# Patient Record
Sex: Female | Born: 1990 | Hispanic: Yes | Marital: Married | Smoking: Former smoker
Health system: Southern US, Community
[De-identification: ages and names within clinical notes are randomized; demographics above are authoritative.]

## PROBLEM LIST (undated history)

## (undated) ENCOUNTER — Inpatient Hospital Stay (HOSPITAL_COMMUNITY): Payer: Self-pay

## (undated) DIAGNOSIS — Z789 Other specified health status: Secondary | ICD-10-CM

## (undated) DIAGNOSIS — F99 Mental disorder, not otherwise specified: Secondary | ICD-10-CM

## (undated) DIAGNOSIS — O0990 Supervision of high risk pregnancy, unspecified, unspecified trimester: Secondary | ICD-10-CM

## (undated) HISTORY — PX: TUBAL LIGATION: SHX77

## (undated) HISTORY — PX: OTHER SURGICAL HISTORY: SHX169

## (undated) HISTORY — DX: Mental disorder, not otherwise specified: F99

## (undated) HISTORY — DX: Supervision of high risk pregnancy, unspecified, unspecified trimester: O09.90

## (undated) HISTORY — PX: BREAST SURGERY: SHX581

---
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
## 2012-02-29 LAB — OB RESULTS CONSOLE GBS: GBS: NEGATIVE

## 2012-03-02 ENCOUNTER — Encounter (HOSPITAL_COMMUNITY): Payer: Self-pay | Admitting: Pharmacist

## 2012-03-12 ENCOUNTER — Encounter (HOSPITAL_COMMUNITY): Payer: Self-pay

## 2012-03-12 ENCOUNTER — Inpatient Hospital Stay (HOSPITAL_COMMUNITY)
Admission: AD | Admit: 2012-03-12 | Discharge: 2012-03-16 | DRG: 766 | Disposition: A | Discharge status: Home / Self Care | Payer: Medicaid Other | Source: Ambulatory Visit | Attending: Obstetrics and Gynecology | Admitting: Obstetrics and Gynecology

## 2012-03-12 DIAGNOSIS — O34219 Maternal care for unspecified type scar from previous cesarean delivery: Principal | ICD-10-CM | POA: Diagnosis present

## 2012-03-12 DIAGNOSIS — Z98891 History of uterine scar from previous surgery: Secondary | ICD-10-CM

## 2012-03-12 DIAGNOSIS — O4290 Premature rupture of membranes, unspecified as to length of time between rupture and onset of labor, unspecified weeks of gestation: Secondary | ICD-10-CM | POA: Diagnosis present

## 2012-03-12 NOTE — MAU Note (Signed)
Pt states, " I started leaking at 1 pm; I bent over and then stood up and warer ran down my legs and it was enough that it dripped on the floor. It has happened three more time since then but not as much."

## 2012-03-13 ENCOUNTER — Encounter (HOSPITAL_COMMUNITY): Payer: Self-pay

## 2012-03-13 ENCOUNTER — Encounter (HOSPITAL_COMMUNITY): Payer: Self-pay | Admitting: *Deleted

## 2012-03-13 ENCOUNTER — Inpatient Hospital Stay (HOSPITAL_COMMUNITY): Payer: Medicaid Other

## 2012-03-13 ENCOUNTER — Encounter (HOSPITAL_COMMUNITY)
Admission: AD | Disposition: A | Discharge status: Home / Self Care | Payer: Self-pay | Source: Ambulatory Visit | Attending: Obstetrics and Gynecology

## 2012-03-13 LAB — CBC
Platelets: 320 10*3/uL (ref 150–400)
RBC: 4.73 MIL/uL (ref 3.87–5.11)
RDW: 14.1 % (ref 11.5–15.5)
WBC: 10.4 10*3/uL (ref 4.0–10.5)

## 2012-03-13 LAB — AMNISURE RUPTURE OF MEMBRANE (ROM) NOT AT ARMC: Amnisure ROM: POSITIVE

## 2012-03-13 LAB — RPR: RPR Ser Ql: NONREACTIVE

## 2012-03-13 SURGERY — Surgical Case
Anesthesia: Spinal | Site: Abdomen | Wound class: Clean Contaminated

## 2012-03-13 MED ORDER — SIMETHICONE 80 MG PO CHEW
80.0000 mg | CHEWABLE_TABLET | Freq: Three times a day (TID) | ORAL | Status: DC
  Administered 2012-03-13 – 2012-03-16 (×11): 80 mg via ORAL

## 2012-03-13 MED ORDER — DIBUCAINE 1 % RE OINT: 1.0000 "application " | TOPICAL_OINTMENT | RECTAL | Status: DC | PRN

## 2012-03-13 MED ORDER — ONDANSETRON HCL 4 MG/2ML IJ SOLN: 4.0000 mg | Freq: Four times a day (QID) | INTRAMUSCULAR | Status: DC | PRN

## 2012-03-13 MED ORDER — OXYCODONE-ACETAMINOPHEN 5-325 MG PO TABS
1.0000 | ORAL_TABLET | ORAL | Status: DC | PRN
  Administered 2012-03-14: 2 via ORAL
  Administered 2012-03-14: 1 via ORAL
  Administered 2012-03-14: 2 via ORAL
  Administered 2012-03-14 – 2012-03-15 (×6): 1 via ORAL
  Administered 2012-03-16 (×3): 2 via ORAL
  Filled 2012-03-13: qty 2
  Filled 2012-03-13: qty 1
  Filled 2012-03-13: qty 2
  Filled 2012-03-13 (×2): qty 1
  Filled 2012-03-13: qty 2
  Filled 2012-03-13 (×4): qty 1
  Filled 2012-03-13 (×2): qty 2

## 2012-03-13 MED ORDER — ONDANSETRON HCL 4 MG/2ML IJ SOLN
4.0000 mg | Freq: Three times a day (TID) | INTRAMUSCULAR | Status: DC | PRN
Start: 2012-03-13 — End: 2012-03-16

## 2012-03-13 MED ORDER — CITRIC ACID-SODIUM CITRATE 334-500 MG/5ML PO SOLN
30.0000 mL | ORAL | Status: DC | PRN
  Administered 2012-03-13: 30 mL via ORAL
  Filled 2012-03-13: qty 15

## 2012-03-13 MED ORDER — LACTATED RINGERS IV SOLN: INTRAVENOUS | Status: DC

## 2012-03-13 MED ORDER — LIDOCAINE HCL (PF) 1 % IJ SOLN: 30.0000 mL | INTRAMUSCULAR | Status: DC | PRN

## 2012-03-13 MED ORDER — PRENATAL MULTIVITAMIN CH
1.0000 | ORAL_TABLET | Freq: Every day | ORAL | Status: DC
  Administered 2012-03-14 – 2012-03-16 (×3): 1 via ORAL
  Filled 2012-03-13 (×3): qty 1

## 2012-03-13 MED ORDER — OXYTOCIN 20 UNITS IN LACTATED RINGERS INFUSION - SIMPLE: 125.0000 mL/h | Freq: Once | INTRAVENOUS | Status: DC

## 2012-03-13 MED ORDER — NALBUPHINE HCL 10 MG/ML IJ SOLN
5.0000 mg | INTRAMUSCULAR | Status: DC | PRN
  Filled 2012-03-13: qty 1

## 2012-03-13 MED ORDER — MAGNESIUM HYDROXIDE 400 MG/5ML PO SUSP: 30.0000 mL | ORAL | Status: DC | PRN

## 2012-03-13 MED ORDER — EPHEDRINE SULFATE 50 MG/ML IJ SOLN
INTRAMUSCULAR | Status: DC | PRN
  Administered 2012-03-13: 20 mg via INTRAVENOUS
  Administered 2012-03-13: 30 mg via INTRAVENOUS
  Administered 2012-03-13: 10 mg via INTRAVENOUS

## 2012-03-13 MED ORDER — CEFAZOLIN SODIUM 1-5 GM-% IV SOLN
1.0000 g | INTRAVENOUS | Status: DC
  Filled 2012-03-13: qty 50

## 2012-03-13 MED ORDER — LANOLIN HYDROUS EX OINT: 1.0000 "application " | TOPICAL_OINTMENT | CUTANEOUS | Status: DC | PRN

## 2012-03-13 MED ORDER — OXYTOCIN 20 UNITS IN LACTATED RINGERS INFUSION - SIMPLE
125.0000 mL/h | INTRAVENOUS | Status: AC
  Administered 2012-03-13: 125 mL/h via INTRAVENOUS
  Filled 2012-03-13: qty 1000

## 2012-03-13 MED ORDER — DIPHENHYDRAMINE HCL 25 MG PO CAPS: 25.0000 mg | ORAL_CAPSULE | Freq: Four times a day (QID) | ORAL | Status: DC | PRN

## 2012-03-13 MED ORDER — DIPHENHYDRAMINE HCL 50 MG/ML IJ SOLN
12.5000 mg | INTRAMUSCULAR | Status: DC | PRN
  Administered 2012-03-13: 12.5 mg via INTRAVENOUS

## 2012-03-13 MED ORDER — NALOXONE HCL 0.4 MG/ML IJ SOLN: 0.4000 mg | INTRAMUSCULAR | Status: DC | PRN

## 2012-03-13 MED ORDER — HYDROMORPHONE HCL PF 1 MG/ML IJ SOLN: 0.2500 mg | INTRAMUSCULAR | Status: DC | PRN

## 2012-03-13 MED ORDER — TETANUS-DIPHTH-ACELL PERTUSSIS 5-2.5-18.5 LF-MCG/0.5 IM SUSP: 0.5000 mL | Freq: Once | INTRAMUSCULAR | Status: DC

## 2012-03-13 MED ORDER — FENTANYL CITRATE 0.05 MG/ML IJ SOLN
INTRAMUSCULAR | Status: DC | PRN
  Administered 2012-03-13 (×3): 25 ug via INTRAVENOUS
  Administered 2012-03-13: 25 ug via INTRATHECAL

## 2012-03-13 MED ORDER — KETOROLAC TROMETHAMINE 30 MG/ML IJ SOLN: 30.0000 mg | Freq: Four times a day (QID) | INTRAMUSCULAR | Status: AC | PRN

## 2012-03-13 MED ORDER — OXYTOCIN BOLUS FROM INFUSION
500.0000 mL | Freq: Once | INTRAVENOUS | Status: DC
  Filled 2012-03-13: qty 500

## 2012-03-13 MED ORDER — DIPHENHYDRAMINE HCL 50 MG/ML IJ SOLN
INTRAMUSCULAR | Status: AC
  Filled 2012-03-13: qty 1

## 2012-03-13 MED ORDER — KETOROLAC TROMETHAMINE 60 MG/2ML IM SOLN
INTRAMUSCULAR | Status: AC
  Filled 2012-03-13: qty 2

## 2012-03-13 MED ORDER — DIPHENHYDRAMINE HCL 25 MG PO CAPS
25.0000 mg | ORAL_CAPSULE | ORAL | Status: DC | PRN
  Filled 2012-03-13: qty 1

## 2012-03-13 MED ORDER — ACETAMINOPHEN 325 MG PO TABS: 650.0000 mg | ORAL_TABLET | ORAL | Status: DC | PRN

## 2012-03-13 MED ORDER — LACTATED RINGERS IV SOLN
INTRAVENOUS | Status: DC
  Administered 2012-03-13: 06:00:00 via INTRAVENOUS

## 2012-03-13 MED ORDER — ONDANSETRON HCL 4 MG/2ML IJ SOLN: 4.0000 mg | INTRAMUSCULAR | Status: DC | PRN

## 2012-03-13 MED ORDER — BUPIVACAINE IN DEXTROSE 0.75-8.25 % IT SOLN
INTRATHECAL | Status: DC | PRN
  Administered 2012-03-13: 1.3 mL via INTRATHECAL

## 2012-03-13 MED ORDER — WITCH HAZEL-GLYCERIN EX PADS: 1.0000 "application " | MEDICATED_PAD | CUTANEOUS | Status: DC | PRN

## 2012-03-13 MED ORDER — SENNOSIDES-DOCUSATE SODIUM 8.6-50 MG PO TABS
2.0000 | ORAL_TABLET | Freq: Every day | ORAL | Status: DC
  Administered 2012-03-13 – 2012-03-15 (×3): 2 via ORAL

## 2012-03-13 MED ORDER — OXYTOCIN 10 UNIT/ML IJ SOLN
INTRAMUSCULAR | Status: AC
  Filled 2012-03-13: qty 4

## 2012-03-13 MED ORDER — KETOROLAC TROMETHAMINE 60 MG/2ML IM SOLN
60.0000 mg | Freq: Once | INTRAMUSCULAR | Status: AC | PRN
  Administered 2012-03-13: 60 mg via INTRAMUSCULAR

## 2012-03-13 MED ORDER — ONDANSETRON HCL 4 MG/2ML IJ SOLN
INTRAMUSCULAR | Status: AC
  Filled 2012-03-13: qty 2

## 2012-03-13 MED ORDER — ONDANSETRON HCL 4 MG PO TABS: 4.0000 mg | ORAL_TABLET | ORAL | Status: DC | PRN

## 2012-03-13 MED ORDER — FLEET ENEMA 7-19 GM/118ML RE ENEM: 1.0000 | ENEMA | RECTAL | Status: DC | PRN

## 2012-03-13 MED ORDER — SIMETHICONE 80 MG PO CHEW: 80.0000 mg | CHEWABLE_TABLET | ORAL | Status: DC | PRN

## 2012-03-13 MED ORDER — IBUPROFEN 600 MG PO TABS: 600.0000 mg | ORAL_TABLET | Freq: Four times a day (QID) | ORAL | Status: DC | PRN

## 2012-03-13 MED ORDER — PHENYLEPHRINE HCL 10 MG/ML IJ SOLN
INTRAMUSCULAR | Status: DC | PRN
  Administered 2012-03-13: 40 ug via INTRAVENOUS

## 2012-03-13 MED ORDER — ONDANSETRON HCL 4 MG/2ML IJ SOLN
INTRAMUSCULAR | Status: DC | PRN
  Administered 2012-03-13: 4 mg via INTRAVENOUS

## 2012-03-13 MED ORDER — EPHEDRINE 5 MG/ML INJ
INTRAVENOUS | Status: AC
  Filled 2012-03-13: qty 10

## 2012-03-13 MED ORDER — MORPHINE SULFATE 0.5 MG/ML IJ SOLN
INTRAMUSCULAR | Status: AC
  Filled 2012-03-13: qty 10

## 2012-03-13 MED ORDER — LACTATED RINGERS IV SOLN: 500.0000 mL | INTRAVENOUS | Status: DC | PRN

## 2012-03-13 MED ORDER — OXYCODONE-ACETAMINOPHEN 5-325 MG PO TABS: 1.0000 | ORAL_TABLET | ORAL | Status: DC | PRN

## 2012-03-13 MED ORDER — LACTATED RINGERS IV SOLN
INTRAVENOUS | Status: DC | PRN
  Administered 2012-03-13 (×3): via INTRAVENOUS

## 2012-03-13 MED ORDER — OXYTOCIN 10 UNIT/ML IJ SOLN
INTRAMUSCULAR | Status: DC | PRN
  Administered 2012-03-13: 20 [IU] via INTRAMUSCULAR
  Administered 2012-03-13: 20 [IU]

## 2012-03-13 MED ORDER — SCOPOLAMINE 1 MG/3DAYS TD PT72
MEDICATED_PATCH | TRANSDERMAL | Status: AC
  Filled 2012-03-13: qty 1

## 2012-03-13 MED ORDER — KETOROLAC TROMETHAMINE 30 MG/ML IJ SOLN
30.0000 mg | Freq: Four times a day (QID) | INTRAMUSCULAR | Status: AC | PRN
  Administered 2012-03-13: 30 mg via INTRAVENOUS
  Filled 2012-03-13: qty 1

## 2012-03-13 MED ORDER — DIPHENHYDRAMINE HCL 50 MG/ML IJ SOLN: 25.0000 mg | INTRAMUSCULAR | Status: DC | PRN

## 2012-03-13 MED ORDER — MEPERIDINE HCL 25 MG/ML IJ SOLN: 6.2500 mg | INTRAMUSCULAR | Status: DC | PRN

## 2012-03-13 MED ORDER — SODIUM CHLORIDE 0.9 % IR SOLN
Status: DC | PRN
  Administered 2012-03-13: 1000 mL

## 2012-03-13 MED ORDER — MORPHINE SULFATE (PF) 0.5 MG/ML IJ SOLN
INTRAMUSCULAR | Status: DC | PRN
  Administered 2012-03-13: .15 mg via INTRATHECAL

## 2012-03-13 MED ORDER — PHENYLEPHRINE 40 MCG/ML (10ML) SYRINGE FOR IV PUSH (FOR BLOOD PRESSURE SUPPORT)
PREFILLED_SYRINGE | INTRAVENOUS | Status: AC
  Filled 2012-03-13: qty 5

## 2012-03-13 MED ORDER — MENTHOL 3 MG MT LOZG: 1.0000 | LOZENGE | OROMUCOSAL | Status: DC | PRN

## 2012-03-13 MED ORDER — IBUPROFEN 600 MG PO TABS
600.0000 mg | ORAL_TABLET | Freq: Four times a day (QID) | ORAL | Status: DC
  Administered 2012-03-13 – 2012-03-16 (×9): 600 mg via ORAL
  Filled 2012-03-13 (×9): qty 1

## 2012-03-13 MED ORDER — SCOPOLAMINE 1 MG/3DAYS TD PT72
1.0000 | MEDICATED_PATCH | Freq: Once | TRANSDERMAL | Status: AC
  Administered 2012-03-13: 1.5 mg via TRANSDERMAL

## 2012-03-13 MED ORDER — FENTANYL CITRATE 0.05 MG/ML IJ SOLN
INTRAMUSCULAR | Status: AC
  Filled 2012-03-13: qty 2

## 2012-03-13 MED ORDER — SODIUM CHLORIDE 0.9 % IV SOLN
1.0000 ug/kg/h | INTRAVENOUS | Status: DC | PRN
  Filled 2012-03-13: qty 2.5

## 2012-03-13 MED ORDER — MEASLES, MUMPS & RUBELLA VAC ~~LOC~~ INJ: 0.5000 mL | INJECTION | Freq: Once | SUBCUTANEOUS | Status: DC

## 2012-03-13 MED ORDER — SODIUM CHLORIDE 0.9 % IJ SOLN: 3.0000 mL | INTRAMUSCULAR | Status: DC | PRN

## 2012-03-13 MED ORDER — CEFAZOLIN SODIUM 1-5 GM-% IV SOLN
1.0000 g | Freq: Once | INTRAVENOUS | Status: AC
  Administered 2012-03-13: 1 g via INTRAVENOUS

## 2012-03-13 MED ORDER — ZOLPIDEM TARTRATE 5 MG PO TABS: 5.0000 mg | ORAL_TABLET | Freq: Every evening | ORAL | Status: DC | PRN

## 2012-03-13 MED ORDER — METOCLOPRAMIDE HCL 5 MG/ML IJ SOLN: 10.0000 mg | Freq: Three times a day (TID) | INTRAMUSCULAR | Status: DC | PRN

## 2012-03-13 SURGICAL SUPPLY — 29 items
CHLORAPREP W/TINT 26ML (MISCELLANEOUS) ×2 IMPLANT
CLOTH BEACON ORANGE TIMEOUT ST (SAFETY) ×2 IMPLANT
CONTAINER PREFILL 10% NBF 15ML (MISCELLANEOUS) IMPLANT
DRSG COVADERM 4X10 (GAUZE/BANDAGES/DRESSINGS) ×2 IMPLANT
ELECT REM PT RETURN 9FT ADLT (ELECTROSURGICAL) ×2
ELECTRODE REM PT RTRN 9FT ADLT (ELECTROSURGICAL) ×1 IMPLANT
EXTRACTOR VACUUM KIWI (MISCELLANEOUS) IMPLANT
EXTRACTOR VACUUM M CUP 4 TUBE (SUCTIONS) IMPLANT
GLOVE BIO SURGEON STRL SZ8 (GLOVE) ×2 IMPLANT
GLOVE ORTHO TXT STRL SZ7.5 (GLOVE) ×2 IMPLANT
GOWN PREVENTION PLUS LG XLONG (DISPOSABLE) ×4 IMPLANT
KIT ABG SYR 3ML LUER SLIP (SYRINGE) IMPLANT
NEEDLE HYPO 25X5/8 SAFETYGLIDE (NEEDLE) ×2 IMPLANT
NS IRRIG 1000ML POUR BTL (IV SOLUTION) ×2 IMPLANT
PACK C SECTION WH (CUSTOM PROCEDURE TRAY) ×2 IMPLANT
PAD ABD 7.5X8 STRL (GAUZE/BANDAGES/DRESSINGS) ×2 IMPLANT
RTRCTR C-SECT PINK 25CM LRG (MISCELLANEOUS) ×2 IMPLANT
SLEEVE SCD COMPRESS KNEE MED (MISCELLANEOUS) ×2 IMPLANT
STAPLER VISISTAT 35W (STAPLE) IMPLANT
SUT CHROMIC 1 CTX 36 (SUTURE) ×4 IMPLANT
SUT PLAIN 0 NONE (SUTURE) IMPLANT
SUT PLAIN 2 0 XLH (SUTURE) IMPLANT
SUT VIC AB 0 CT1 27 (SUTURE) ×2
SUT VIC AB 0 CT1 27XBRD ANBCTR (SUTURE) ×2 IMPLANT
SUT VIC AB 4-0 KS 27 (SUTURE) ×2 IMPLANT
TAPE CLOTH SURG 4X10 WHT LF (GAUZE/BANDAGES/DRESSINGS) ×2 IMPLANT
TOWEL OR 17X24 6PK STRL BLUE (TOWEL DISPOSABLE) ×4 IMPLANT
TRAY FOLEY CATH 14FR (SET/KITS/TRAYS/PACK) ×2 IMPLANT
WATER STERILE IRR 1000ML POUR (IV SOLUTION) ×2 IMPLANT

## 2012-03-13 NOTE — Consult Note (Signed)
Neonatology Note:  Attendance at C-section:  I was asked to attend this repeat C/S at 38 weeks due to SROM. The mother is a G2P1 O pos, GBS neg with an uncomplicated pregnancy. ROM 20 hour prior to delivery, fluid clear. Infant vigorous with good spontaneous cry and tone. Needed only minimal bulb suctioning. Ap 9/9. Lungs clear to ausc in DR. To CN to care of Pediatrician.  Ermal Brzozowski, MD  

## 2012-03-13 NOTE — Anesthesia Postprocedure Evaluation (Signed)
  Anesthesia Post-op Note  Patient: Carrie Alvarado  Procedure(s) Performed: Procedure(s) (LRB): CESAREAN SECTION (N/A)  Patient Location: PACU and Mother/Baby  Anesthesia Type: Spinal  Level of Consciousness: awake, alert  and oriented  Airway and Oxygen Therapy: Patient Spontanous Breathing  Post-op Pain: mild  Post-op Assessment: Post-op Vital signs reviewed  Post-op Vital Signs: Reviewed and stable  Complications: No apparent anesthesia complications

## 2012-03-13 NOTE — Anesthesia Preprocedure Evaluation (Signed)

## 2012-03-13 NOTE — Op Note (Signed)
Preoperative diagnosis: Intrauterine pregnancy at 38 weeks, PROM, previous cesarean section Postoperative diagnosis: Same Procedure: Repeat low transverse cesarean section without extensions Surgeon: Lavina Hamman M.D. Anesthesia: Spinal Findings: Patient had normal gravid anatomy and delivered a viable female infant with Apgars of 9 and 9, weight pending Estimated blood loss: 800 cc Specimens: Placenta sent to labor and delivery Complications: None  Procedure in detail: The patient was taken to the operating room and placed in the sitting position. Dr. Cristela Blue instilled spinal anesthesia. Abdomen was then prepped and draped in the usual sterile fashion. A Foley catheter was placed. The level of her anesthesia was found to be adequate. Abdomen was entered via a standard Pfannenstiel incision through her previous scar. Once the peritoneal cavity was entered the Alexis disposable self-retaining retractor was placed and good visualization was achieved. A 4 cm transverse incision was then made in the lower uterine segment pushing the bladder inferior. Once the uterine cavity was entered the incision was extended digitally. The fetal vertex was grasped and delivered through the incision atraumatically. Mouth and nares were suctioned. The remainder of the infant then delivered atraumatically. Cord was doubly clamped and cut and the infant handed to the awaiting pediatric team. Cord blood was obtained. The placenta delivered spontaneously. Uterus was wiped dry with clean lap pad and all clots and debris were removed. Uterine incision was inspected and found to be free of extensions. Uterine incision was closed in 1 layer with running locking layer with #1 chromic.  Bleeding from the right angle was controlled with 2 figure 8 sutures of #1 chromic.  Tubes and ovaries were inspected and found to be normal. Uterine incision was inspected and found to be hemostatic. Bleeding from serosal edges was controlled  with electrocautery. The Alexis retractor was removed. Subfascial space was irrigated and made hemostatic with electrocautery. Fascia was closed in running fashion starting at both ends and meeting in the middle with 0 Vicryl. Subcutaneous tissue was then irrigated and made hemostatic with electrocautery. Skin was closed with staples followed by a sterile dressing. Patient tolerated the procedure well and was taken to the recovery in stable condition. Counts were correct x2, she received Ancef 1 g IV at the beginning of the procedure and she had PAS hose on throughout the procedure.

## 2012-03-13 NOTE — Anesthesia Postprocedure Evaluation (Signed)
  Anesthesia Post-op Note  Patient: Carrie Alvarado  Procedure(s) Performed: Procedure(s) (LRB): CESAREAN SECTION (N/A)  Patient is awake, responsive, moving her legs, and has signs of resolution of her numbness. Pain and nausea are reasonably well controlled. Vital signs are stable and clinically acceptable. Oxygen saturation is clinically acceptable. There are no apparent anesthetic complications at this time. Patient is ready for discharge.

## 2012-03-13 NOTE — H&P (Signed)
Carrie Alvarado is a 21 y.o. female presenting for evaluation of leaking fluid.  She had been leaking small amounts of clear fluid off and on all day.  She presented to MAU last pm with this complaint.  Fern neg, but Amnisure pos.  She ate a full meal at 2100 last pm, so she was admitted for observation for c-section this am for PPROM.  Prenatal care complicated by previous c-section for fetal hydrops and chest mass, baby died.  She was considering VBAC, but wants repeat c-section.  See prenatal records for complete history. . Maternal Medical History:  Reason for admission: Reason for admission: rupture of membranes.  Fetal activity: Perceived fetal activity is normal.    Prenatal complications: no prenatal complications   OB History    Grav Para Term Preterm Abortions TAB SAB Ect Mult Living   2 1 0 1 0 0 0 0 0 0     Baby had hydrops from a chest mass and heart failure, baby died  Past Medical History  Diagnosis Date  . No pertinent past medical history    Past Surgical History  Procedure Date  . Cesarean section    Family History: family history is negative for Hypotension and Anesthesia problems. Social History:  reports that she has quit smoking. She has never used smokeless tobacco. She reports that she does not drink alcohol or use illicit drugs.  Review of Systems  Respiratory: Negative.   Cardiovascular: Negative.     Dilation: 1 Effacement (%): 60 Station: -2 Exam by:: Artelia Laroche CNM Blood pressure 102/57, pulse 80, temperature 98.2 F (36.8 C), temperature source Oral, resp. rate 20, height 5' (1.524 m), weight 70.024 kg (154 lb 6 oz), last menstrual period 06/07/2011, SpO2 100.00%. Maternal Exam:  Uterine Assessment: Contraction strength is mild.  Abdomen: Patient reports no abdominal tenderness. Surgical scars: low transverse.   Estimated fetal weight is 6 1/2 lbs.   Fetal presentation: vertex  Introitus: Normal vulva. Normal vagina.  Ferning test: negative.    Amniotic fluid character: clear.  Pelvis: adequate for delivery.   Cervix: Cervix evaluated by digital exam.     Fetal Exam Fetal Monitor Review: Mode: ultrasound.   Baseline rate: 140.  Variability: moderate (6-25 bpm).   Pattern: accelerations present and no decelerations.    Fetal State Assessment: Category I - tracings are normal.     Physical Exam  Constitutional: She appears well-developed and well-nourished.  Cardiovascular: Normal rate, regular rhythm and normal heart sounds.   No murmur heard. Respiratory: Effort normal and breath sounds normal. No respiratory distress. She has no wheezes.  GI: Soft.       gravid    Prenatal labs: ABO, Rh: --/--/O POS (09/22 0126) Antibody: Negative (11/01 0000) Rubella: Immune (11/01 0000) RPR: Nonreactive (11/01 0000)  HBsAg: Negative (11/01 0000)  HIV: Non-reactive (11/01 0000)  GBS: Negative (04/22 0000)   Assessment/Plan: IUP at 38 weeks with PPROM, prior LTCS, wants repeat c-section.  We are now > 8 hrs since last meal, ok to proceed with repeat c-section.  The procedure and risks have been discussed with the patient.     Brynleigh Sequeira D 03/13/2012, 8:28 AM

## 2012-03-13 NOTE — OR Nursing (Signed)
Uterus massaged by S. Merrily Tegeler RN. Two tubes of cord blood sent to lab.  

## 2012-03-13 NOTE — Transfer of Care (Signed)
Immediate Anesthesia Transfer of Care Note  Patient: Carrie Alvarado  Procedure(s) Performed: Procedure(s) (LRB): CESAREAN SECTION (N/A)  Patient Location: PACU  Anesthesia Type: Spinal  Level of Consciousness: awake, alert  and oriented  Airway & Oxygen Therapy: Patient Spontanous Breathing  Post-op Assessment: Report given to PACU RN and Post -op Vital signs reviewed and stable  Post vital signs: stable  Complications: No apparent anesthesia complications

## 2012-03-13 NOTE — Addendum Note (Signed)
Addendum  created 03/13/12 2126 by Armanda Heritage, RN   Modules edited:Notes Section

## 2012-03-13 NOTE — Anesthesia Procedure Notes (Signed)

## 2012-03-14 ENCOUNTER — Encounter (HOSPITAL_COMMUNITY): Payer: Self-pay | Admitting: Obstetrics and Gynecology

## 2012-03-14 LAB — CBC
Platelets: 294 10*3/uL (ref 150–400)
RBC: 4.05 MIL/uL (ref 3.87–5.11)
RDW: 14.2 % (ref 11.5–15.5)
WBC: 15 10*3/uL — ABNORMAL HIGH (ref 4.0–10.5)

## 2012-03-14 NOTE — Progress Notes (Signed)
Subjective: Postpartum Day #1: Cesarean Delivery Patient reports tolerating PO and no problems voiding.    Objective: Vital signs in last 24 hours: Temp:  [97.3 F (36.3 C)-99.1 F (37.3 C)] 98.6 F (37 C) (05/06 0400) Pulse Rate:  [80-112] 112  (05/06 0400) Resp:  [18-28] 18  (05/06 0400) BP: (98-143)/(45-77) 101/63 mmHg (05/06 0400) SpO2:  [96 %-100 %] 96 % (05/06 0400)  Physical Exam:  General: alert Lochia: appropriate Uterine Fundus: firm Incision: dressing C/D/I   Basename 03/14/12 0530 03/13/12 0055  HGB 10.9* 13.0  HCT 34.3* 39.8    Assessment/Plan: Status post Cesarean section. Doing well postoperatively.  Continue current care, ambulate.  Anabeth Chilcott D 03/14/2012, 8:13 AM

## 2012-03-14 NOTE — Progress Notes (Signed)
UR chart review completed.  

## 2012-03-15 ENCOUNTER — Inpatient Hospital Stay (HOSPITAL_COMMUNITY): Admission: RE | Admit: 2012-03-15 | Payer: Medicaid Other | Source: Ambulatory Visit

## 2012-03-15 NOTE — Progress Notes (Signed)
POD #2 LTCS Doing ok, no problems Afeb, VSS Abd- soft, fundus firm, dressing C/D/I Continue routine care

## 2012-03-16 DIAGNOSIS — Z98891 History of uterine scar from previous surgery: Secondary | ICD-10-CM

## 2012-03-16 MED ORDER — IBUPROFEN 600 MG PO TABS: 600.0000 mg | ORAL_TABLET | Freq: Four times a day (QID) | ORAL | Status: AC

## 2012-03-16 MED ORDER — OXYCODONE-ACETAMINOPHEN 5-325 MG PO TABS: 1.0000 | ORAL_TABLET | ORAL | Status: AC | PRN

## 2012-03-16 NOTE — Clinical Social Work Psychosocial (Signed)
    Clinical Social Work Department BRIEF PSYCHOSOCIAL ASSESSMENT 03/16/2012  Patient:  Carrie Alvarado, Carrie Alvarado     Account Number:  [id number]     Admit date:  03/12/2012  Clinical Social Worker:  Andy Gauss  Date/Time:  03/16/2012 10:30 AM  Referred by:  Physician  Date Referred:  03/16/2012 Referred for  Behavioral Health Issues   Other Referral:   Interview type:  Patient Other interview type:    PSYCHOSOCIAL DATA Living Status:  FAMILY Admitted from facility:   Level of care:   Primary support name:   Primary support relationship to patient:  FRIEND Degree of support available:   Involved    CURRENT CONCERNS Current Concerns  Behavioral Health Issues   Other Concerns:    SOCIAL WORK ASSESSMENT / PLAN Sw met with pt briefly to assess history of depression, after the loss of her child in 2012.  Pt told Sw that she participated in counseling and feels "a lot better now."  She denies any depression since that situation.  FOB is at the bedside and supportive.  Pt denies any Etoh since pregnancy confirmation.  She appears appropriate and happy about the birth of this baby.   Assessment/plan status:  No Further Intervention Required Other assessment/ plan:   Information/referral to community resources:    PATIENT'S/FAMILY'S RESPONSE TO PLAN OF CARE: Pt thanked Sw for consult.

## 2012-03-16 NOTE — Progress Notes (Signed)
POD #3 LTCS No problems Afeb, VSS Abd- soft, fundus firm, incision intact D/c home 

## 2012-03-16 NOTE — Discharge Summary (Signed)
Obstetric Discharge Summary Reason for Admission: rupture of membranes Prenatal Procedures: none Intrapartum Procedures: cesarean: low cervical, transverse Postpartum Procedures: none Complications-Operative and Postpartum: none Hemoglobin  Date Value Range Status  03/14/2012 10.9* 12.0-15.0 (g/dL) Final     HCT  Date Value Range Status  03/14/2012 34.3* 36.0-46.0 (%) Final    Physical Exam:  General: alert Lochia: appropriate Uterine Fundus: firm Incision: healing well  Discharge Diagnoses: Term Pregnancy-delivered  Discharge Information: Date: 03/16/2012 Activity: pelvic rest and no strenuous activity Diet: routine Medications: Ibuprofen and Percocet Condition: stable Instructions: refer to practice specific booklet Discharge to: home Follow-up Information    Follow up with Avea Mcgowen D, MD. Schedule an appointment as soon as possible for a visit in 2 weeks.   Contact information:   [address] [place] [address] [phone]          Newborn Data: Live born female  Birth Weight: 6 lb 13.2 oz (3095 g) APGAR: 9, 9  Home with mother.  Araeya Lamb D 03/16/2012, 7:48 AM

## 2012-03-16 NOTE — Discharge Instructions (Signed)
As per discharge pamphlet °

## 2012-03-21 ENCOUNTER — Encounter (HOSPITAL_COMMUNITY): Admission: RE | Payer: Self-pay | Source: Ambulatory Visit

## 2012-03-21 ENCOUNTER — Inpatient Hospital Stay (HOSPITAL_COMMUNITY)
Admission: RE | Admit: 2012-03-21 | Payer: Medicaid Other | Source: Ambulatory Visit | Admitting: Obstetrics and Gynecology

## 2012-03-21 SURGERY — Surgical Case
Anesthesia: Choice

## 2012-05-04 ENCOUNTER — Emergency Department (HOSPITAL_COMMUNITY)
Admission: EM | Admit: 2012-05-04 | Discharge: 2012-05-04 | Disposition: A | Discharge status: Home / Self Care | Payer: Medicaid Other | Attending: Emergency Medicine | Admitting: Emergency Medicine

## 2012-05-04 ENCOUNTER — Encounter (HOSPITAL_COMMUNITY): Payer: Self-pay | Admitting: Family Medicine

## 2012-05-04 DIAGNOSIS — H1010 Acute atopic conjunctivitis, unspecified eye: Secondary | ICD-10-CM

## 2012-05-04 DIAGNOSIS — H1045 Other chronic allergic conjunctivitis: Secondary | ICD-10-CM | POA: Insufficient documentation

## 2012-05-04 DIAGNOSIS — Z87891 Personal history of nicotine dependence: Secondary | ICD-10-CM | POA: Insufficient documentation

## 2012-05-04 MED ORDER — NEDOCROMIL SODIUM 2 % OP SOLN: 1.0000 [drp] | Freq: Two times a day (BID) | OPHTHALMIC | Status: AC | PRN

## 2012-05-04 MED ORDER — NAPHAZOLINE-PHENIRAMINE 0.025-0.3 % OP SOLN: 1.0000 [drp] | OPHTHALMIC | Status: AC | PRN

## 2012-05-04 NOTE — ED Provider Notes (Signed)
History    This chart was scribed for Carrie Shi, MD, MD by Smitty Pluck. The patient was seen in room TR02C and the patient's care was started at 12:23PM.   CSN: [id number]  Arrival date & time 05/04/12  1048   First MD Initiated Contact with Patient 05/04/12 1222      Chief Complaint  Patient presents with  . Eye Problem    (Consider location/radiation/quality/duration/timing/severity/associated sxs/prior treatment) Patient is a 21 y.o. female presenting with eye problem. The history is provided by the patient.  Eye Problem    Carrie Alvarado is a 21 y.o. female who presents to the Emergency Department complaining of moderate eye pain onset 2 weeks ago. Pt denies wearing contact lenses. She reports that she has drainage in the morning and at night time. Denies allergies. She reports that her eyes are burning and itching. Symptoms have been constant. The itching has spread to her cheeks. She denies changing facial makeup within past couple of weeks. She states symptoms started with itching. She has tried benadryl and eye drops without relief. She reports that her eyes stick together in the morning.   Past Medical History  Diagnosis Date  . No pertinent past medical history     Past Surgical History  Procedure Date  . Cesarean section   . Cesarean section 03/13/2012    Procedure: CESAREAN SECTION;  Surgeon: Lavina Hamman, MD;  Location: [facility];  Service: Gynecology;  Laterality: N/A;  Repeat cesarean section with delivery of baby girl at 11.  Apgars 9/9.    Family History  Problem Relation Age of Onset  . Hypotension Neg Hx   . Anesthesia problems Neg Hx     History  Substance Use Topics  . Smoking status: Former Games developer  . Smokeless tobacco: Never Used  . Alcohol Use: No     not with pregnancy    OB History    Grav Para Term Preterm Abortions TAB SAB Ect Mult Living   2 2 1 1  0 0 0 0 0 1      Review of Systems  All other systems reviewed and are  negative.  10 Systems reviewed and all are negative for acute change except as noted in the HPI.    Allergies  Review of patient's allergies indicates no known allergies.  Home Medications   Current Outpatient Rx  Name Route Sig Dispense Refill  . NAPHAZOLINE-PHENIRAMINE 0.025-0.3 % OP SOLN Ophthalmic Apply 1 drop to eye every 4 (four) hours as needed. 5 mL 1  . NEDOCROMIL SODIUM 2 % OP SOLN Both Eyes Place 1 drop into both eyes 2 (two) times daily as needed. 5 mL 0    BP 104/56  Pulse 74  Temp 97.9 F (36.6 C) (Oral)  Resp 16  SpO2 100%  LMP 04/26/2012  Physical Exam  Nursing note and vitals reviewed. Constitutional: She is oriented to person, place, and time. She appears well-developed and well-nourished. No distress.  HENT:  Head: Normocephalic and atraumatic.  Eyes: EOM are normal. Pupils are equal, round, and reactive to light. Right eye exhibits no exudate. Left eye exhibits no exudate. Right conjunctiva is injected. Right conjunctiva has no hemorrhage. Left conjunctiva is injected. Left conjunctiva has no hemorrhage.  Neck: Normal range of motion.  Cardiovascular: Normal rate and intact distal pulses.   Pulmonary/Chest: No respiratory distress.  Abdominal: Normal appearance. She exhibits no distension.  Musculoskeletal: Normal range of motion.  Neurological: She is alert and oriented to person,  place, and time. No cranial nerve deficit.  Skin: Skin is warm and dry. No rash noted.  Psychiatric: She has a normal mood and affect. Her behavior is normal.    ED Course  Procedures (including critical care time) DIAGNOSTIC STUDIES: Oxygen Saturation is 100% on room air, normal by my interpretation.    COORDINATION OF CARE: 12:27PM EDP discusses pt ED treatment with pt.    Labs Reviewed - No data to display No results found.   1. Allergic conjunctivitis       MDM  I personally performed the services described in this documentation, which was scribed in my  presence. The recorded information has been reviewed and considered.           Carrie Shi, MD 05/04/12 [phone]

## 2012-05-04 NOTE — ED Notes (Signed)
Pt sts itchy watery eyes for a few days. Denies coming into contact with anything new

## 2012-05-04 NOTE — ED Notes (Signed)
Both eyes watering and burning x 1 week eyes are red and sensitive to light

## 2012-05-04 NOTE — ED Notes (Signed)
Pt reports eye burning and irritation x 1 week.  Pt denies contact/glasses.  Denies injury.  No drainage noted.

## 2012-05-04 NOTE — Discharge Instructions (Signed)
Allergic Conjunctivitis  A thin membrane (conjunctiva) covers the eyeball and underside of the eyelids. Allergic conjunctivitis happens when the thin membrane gets irritated from things like animal dander, pollen, perfumes, or smoke (allergens). The membrane may become puffy (swollen) and red. Small bumps may form on the inside of the eyelids. Your eyes may get teary, itchy, or burn. It cannot be passed to another person (contagious).   HOME CARE   Wash your hands before and after applying medicated drops or creams.   Do not touch the drop or cream tube to your eye or eyelids.   Do not use your soft contacts. Throw them away. Use a new pair once recovery is complete.   Do not use your hard contacts. They need to be washed (sterilized) thoroughly after recovery is complete.   Put a cold cloth to your eye(s) if you have itching and burning.  GET HELP RIGHT AWAY IF:    You are not feeling better in 2 to 3 days after treatment.   Your lids are sticky or stick together.   Fluid comes from the eye(s).   You become sensitive to light.   You have a temperature by mouth above 102 F (38.9 C).   You have pain in and around the eye(s).   You start to have vision problems.  MAKE SURE YOU:    Understand these instructions.   Will watch your condition.   Will get help right away if you are not doing well or get worse.  Document Released: 04/15/2010 Document Revised: 10/15/2011 Document Reviewed: 04/15/2010  Exit[employer] Patient Information 2012 [employer].

## 2012-07-16 IMAGING — US US OB TRANSVAGINAL
1 series · 14 of 20 positions shown · non-contrast
Comparison: 08/01/2011

CLINICAL DATA: Fetal heart tones only.  Vaginal bleeding.

TRANSVAGINAL OB ULTRASOUND
TECHNIQUE: Transvaginal ultrasound was performed for evaluation of
the gestation as well as the maternal uterus and adnexal regions.

[Series 1: us ob transvaginal · 20 acquisitions, 14 frames shown]
[im 1/20]
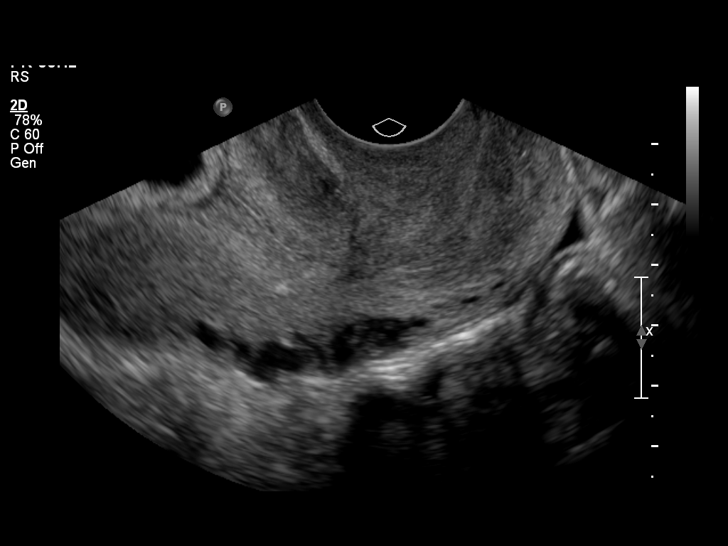
[im 3/20]
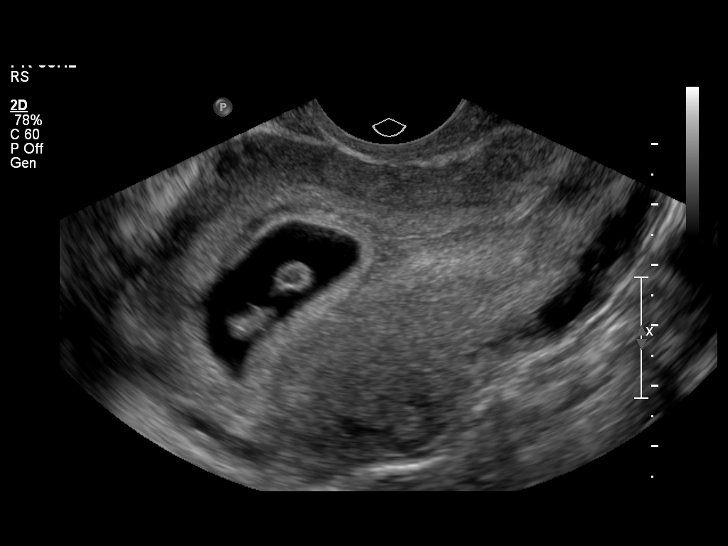
[im 4/20]
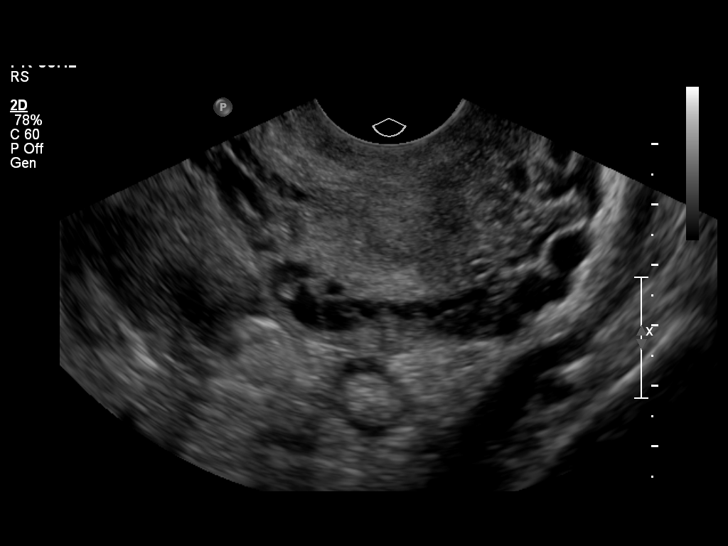
[im 6/20]
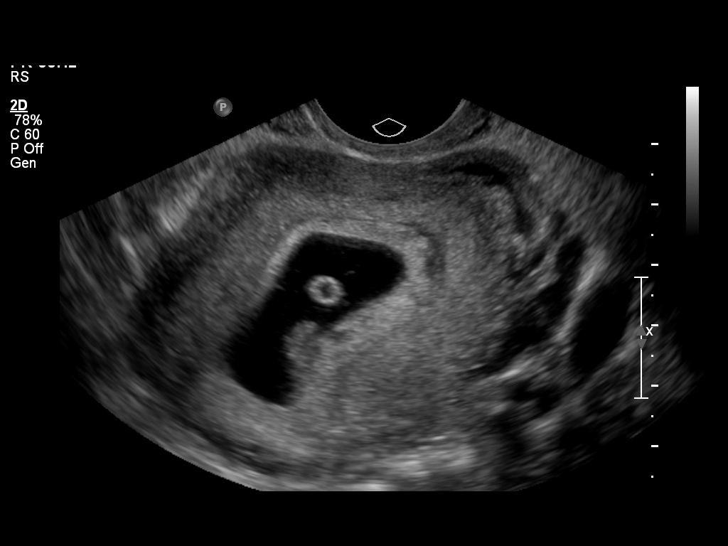
[im 7/20]
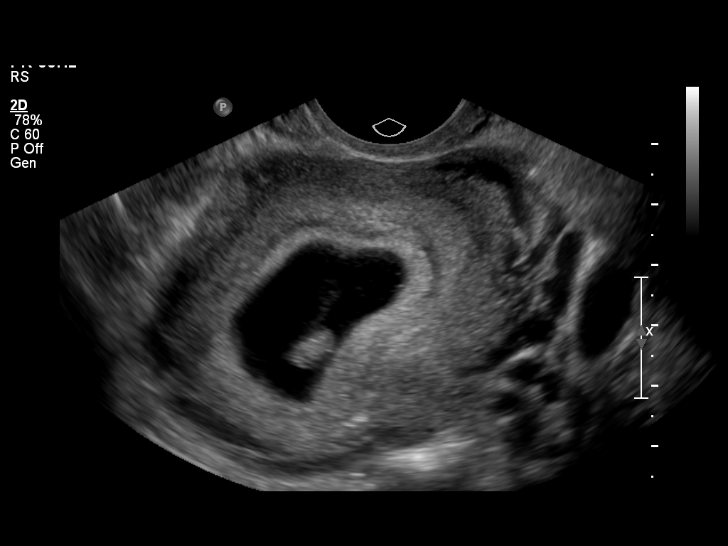
[im 8/20]
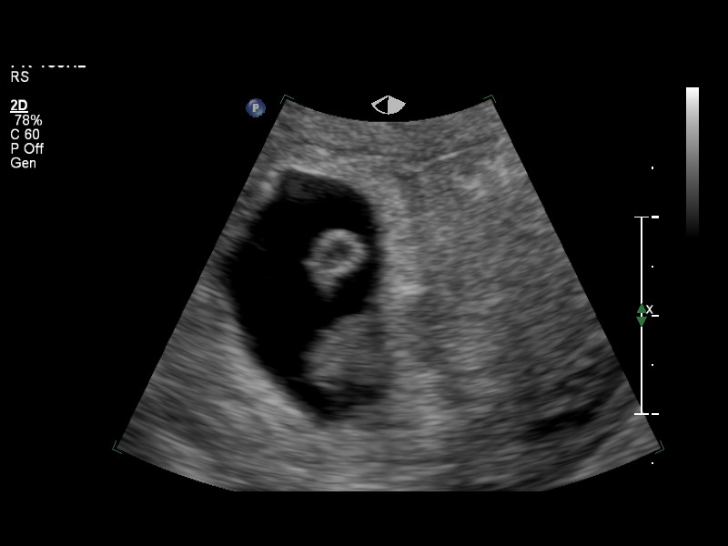
[im 10/20]
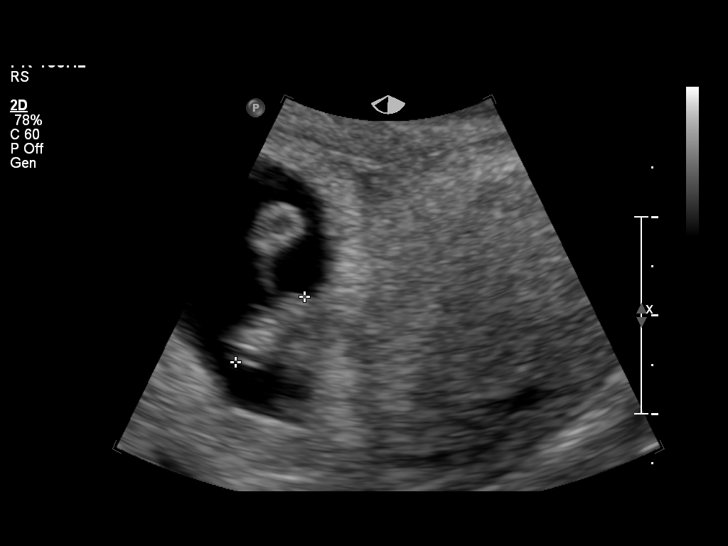
[im 11/20]
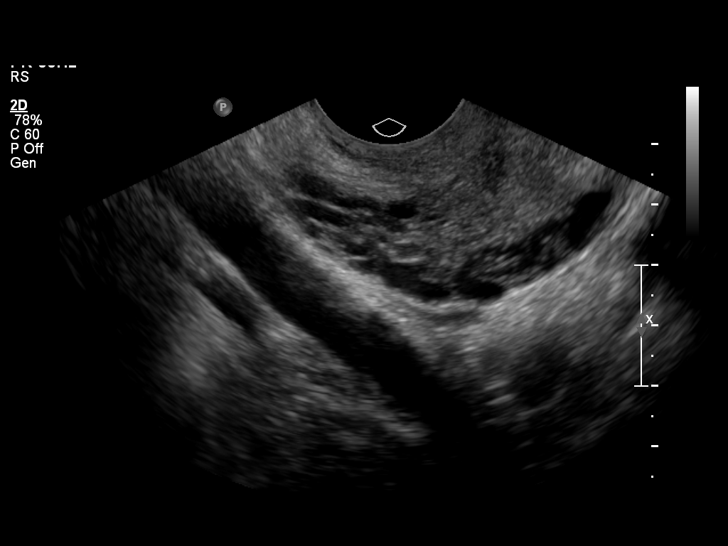
[im 13/20]
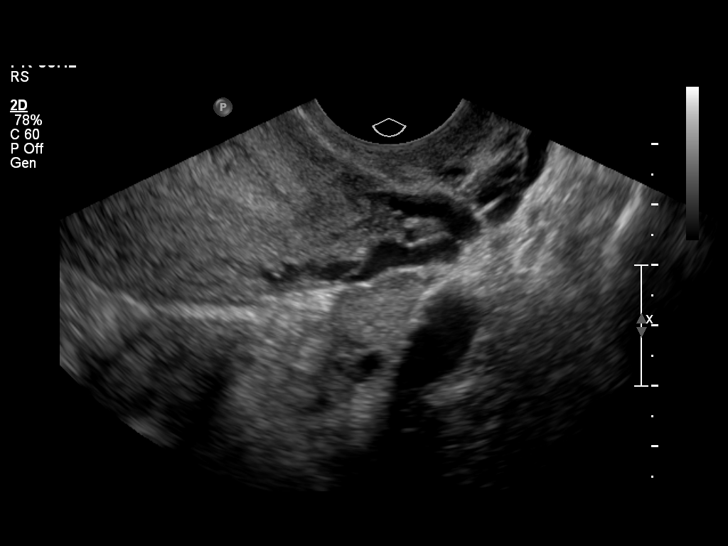
[im 14/20]
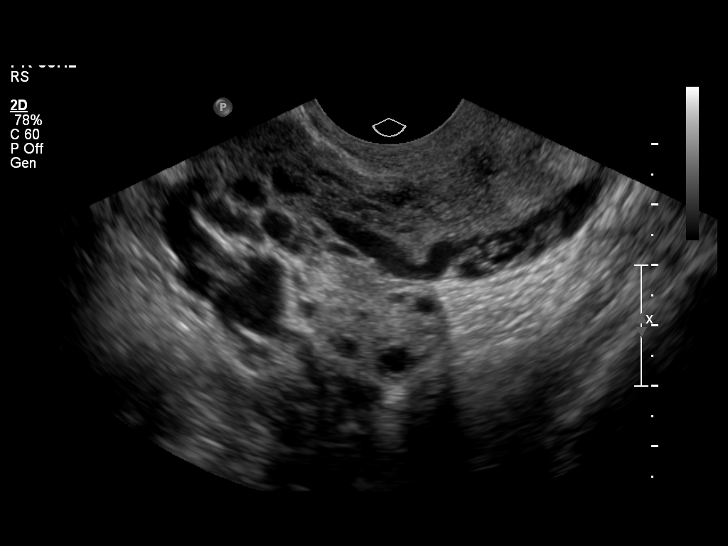
[im 16/20]
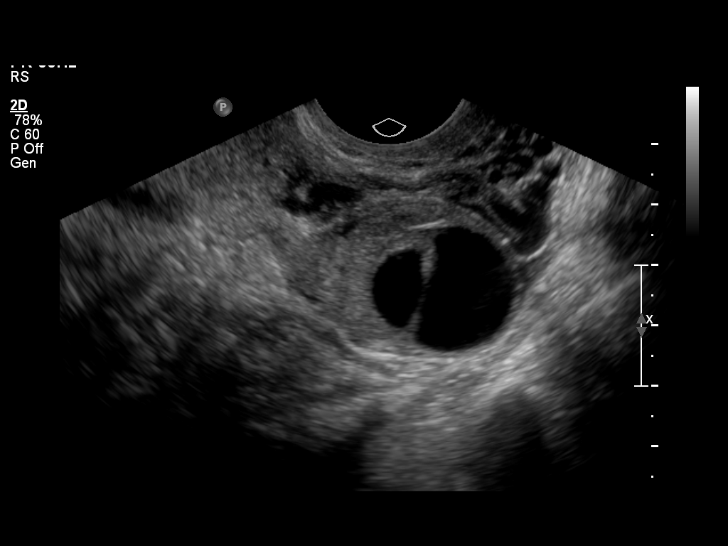
[im 17/20]
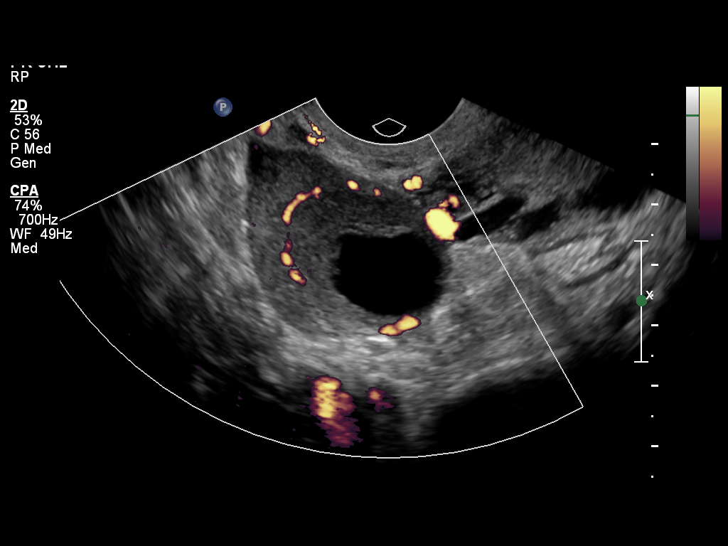
[im 18/20]
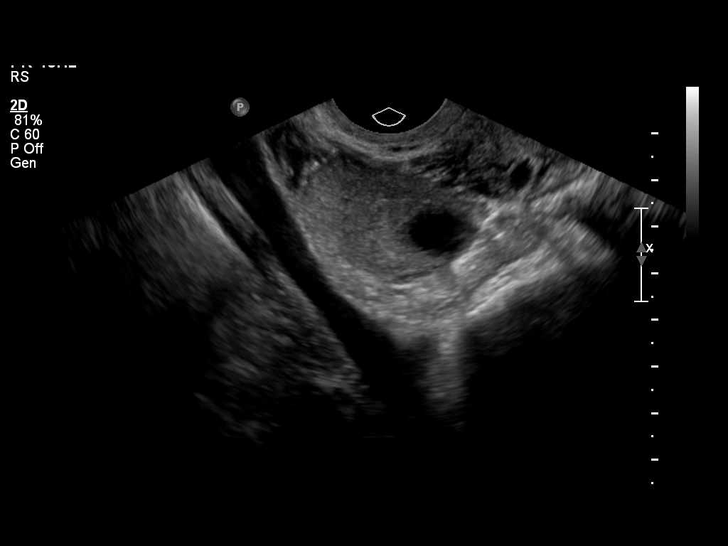
[im 20/20]
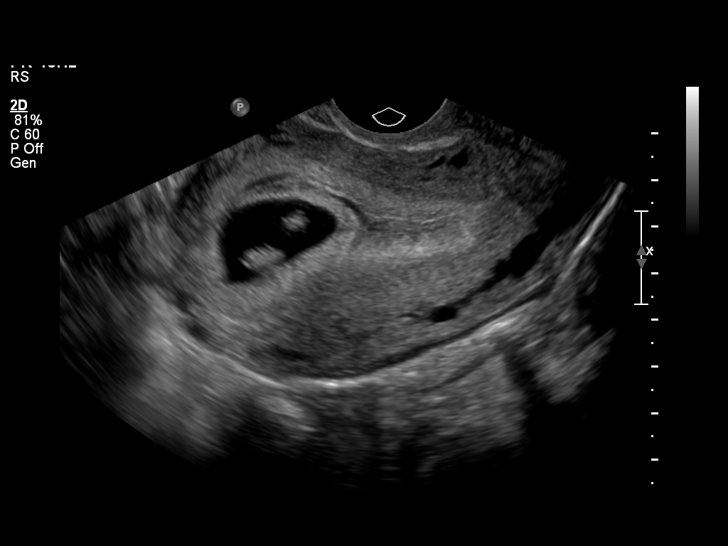

[14 of 20 positions shown; findings below may reference images not displayed]

FINDINGS: A single intrauterine pregnancy is demonstrated.  Fetal
pole and yolk sac are observed.  The crown-rump length measures
mm consistent with estimated gestational age of 7 weeks 1 day.
Fetal cardiac activity is observed with fetal heart rate measured
at 133 beats per minute.  No myometrial masses demonstrated.  No
fluid in the lower uterine segment.  Cyst in the right ovary
suggesting corpus luteal cyst.  Normal follicular changes in the
left ovary.  No free pelvic fluid collections.
IMPRESSION: Single intrauterine pregnancy demonstrated with fetal cardiac
activity observed.  Crown-rump length is consistent with estimated
gestational age of 7 weeks 1 day.

## 2013-06-24 ENCOUNTER — Ambulatory Visit: Payer: Self-pay | Admitting: Family Medicine

## 2013-06-24 VITALS — BP 108/60 | HR 76 | Temp 98.6°F | Resp 16 | Ht 60.0 in | Wt 130.4 lb

## 2013-06-24 DIAGNOSIS — H109 Unspecified conjunctivitis: Secondary | ICD-10-CM

## 2013-06-24 MED ORDER — TOBRAMYCIN 0.3 % OP SOLN: 1.0000 [drp] | Freq: Four times a day (QID) | OPHTHALMIC | Status: DC

## 2013-06-24 NOTE — Progress Notes (Signed)
Chief Complaint:  Chief Complaint  Patient presents with  . Eye Pain    Both Eye pain x 2 weeks    HPI: 22 y.o. year old female presents with a 20 day history of eye redness involving both eye.  no contact use  No sick contacts, recent antibiotics, or recent travels.   No leg trauma, sedentary periods, h/o cancer, or tobacco use.  Past Medical History  Diagnosis Date  . No pertinent past medical history      Home Meds: Prior to Admission medications   Medication Sig Start Date End Date Taking? Authorizing Provider  tobramycin (TOBREX) 0.3 % ophthalmic solution Place 1 drop into both eyes every 6 (six) hours. 06/24/13   Elvina Sidle, MD    Allergies: No Known Allergies  History   Social History  . Marital Status: Single    Spouse Name: N/A    Number of Children: N/A  . Years of Education: N/A   Occupational History  . Not on file.   Social History Main Topics  . Smoking status: Current Some Day Smoker  . Smokeless tobacco: Never Used  . Alcohol Use: No     Comment: not with pregnancy  . Drug Use: No     Comment: not with pregnancy  . Sexual Activity: Yes   Other Topics Concern  . Not on file   Social History Narrative  . No narrative on file     Review of Systems: Constitutional: negative for chills, fever, night sweats or weight changes Cardiovascular: negative for chest pain or palpitations Respiratory: negative for hemoptysis, wheezing, or shortness of breath Abdominal: negative for abdominal pain, nausea, vomiting or diarrhea Dermatological: negative for rash Neurologic: negative for headache   Physical Exam: Blood pressure 108/60, pulse 76, temperature 98.6 F (37 C), temperature source Oral, resp. rate 16, height 5' (1.524 m), weight 130 lb 6.4 oz (59.149 kg), last menstrual period 06/09/2013, SpO2 100.00%., Body mass index is 25.47 kg/(m^2). General: Well developed, well nourished, in no acute distress. Head: Normocephalic, atraumatic, nares  are congested. Bilateral auditory canals clear, TM's are without perforation, pearly grey with reflective cone of light bilaterally. No sinus TTP. Oral cavity moist, dentition normal. Posterior pharynx with post nasal drip and mild erythema. No peritonsillar abscess or tonsillar exudate. Eyes:  Injection both eye, mild discharge at lid margins Neck: Supple. No thyromegaly. Full ROM. No lymphadenopathy. Lungs: Coarse breath sounds bilaterally without wheezes, rales, or rhonchi. Breathing is unlabored.  Heart: RRR with S1 S2. No murmurs, rubs, or gallops appreciated. Msk:  Strength and tone normal for age. Extremities: No clubbing or cyanosis. No edema. Neuro: Alert and oriented X 3. Moves all extremities spontaneously. CNII-XII grossly in tact. Psych:  Responds to questions appropriately with a normal affect.   Labs:   ASSESSMENT AND PLAN:  22 y.o. year old female with conjunctivitis involving each eye Conjunctivitis - Plan: tobramycin (TOBREX) 0.3 % ophthalmic solution  Signed, Elvina Sidle, MD  Pt ed given including hygiene measures and avoiding use of contact lenses -RTC precautions -RTC 3-5 days if no improvement  Signed, Elvina Sidle, MD 06/24/2013 5:37 PM

## 2013-06-24 NOTE — Patient Instructions (Addendum)

## 2013-06-28 ENCOUNTER — Telehealth: Payer: Self-pay

## 2013-06-28 DIAGNOSIS — H109 Unspecified conjunctivitis: Secondary | ICD-10-CM

## 2013-06-28 NOTE — Telephone Encounter (Signed)
Patient was seen for eye issues, meds helped a little, not a lot of improvement.   [phone]

## 2013-06-28 NOTE — Telephone Encounter (Signed)
Dr L any more suggestions?

## 2013-06-29 MED ORDER — PREDNISOLONE ACETATE 1 % OP SUSP: 1.0000 [drp] | Freq: Three times a day (TID) | OPHTHALMIC | Status: DC

## 2013-06-29 NOTE — Telephone Encounter (Signed)
Add these drops for 3 days.

## 2013-06-29 NOTE — Telephone Encounter (Signed)
Called pt, Mailbox is full.

## 2018-06-08 ENCOUNTER — Inpatient Hospital Stay (HOSPITAL_COMMUNITY): Payer: Self-pay

## 2018-06-08 ENCOUNTER — Inpatient Hospital Stay (HOSPITAL_COMMUNITY)
Admission: AD | Admit: 2018-06-08 | Discharge: 2018-06-08 | Disposition: A | Discharge status: Home / Self Care | Payer: Self-pay | Source: Ambulatory Visit | Attending: Family Medicine | Admitting: Family Medicine

## 2018-06-08 ENCOUNTER — Encounter (HOSPITAL_COMMUNITY): Payer: Self-pay | Admitting: *Deleted

## 2018-06-08 DIAGNOSIS — O26899 Other specified pregnancy related conditions, unspecified trimester: Secondary | ICD-10-CM

## 2018-06-08 DIAGNOSIS — Z3491 Encounter for supervision of normal pregnancy, unspecified, first trimester: Secondary | ICD-10-CM

## 2018-06-08 DIAGNOSIS — O26851 Spotting complicating pregnancy, first trimester: Secondary | ICD-10-CM | POA: Insufficient documentation

## 2018-06-08 DIAGNOSIS — O209 Hemorrhage in early pregnancy, unspecified: Secondary | ICD-10-CM

## 2018-06-08 DIAGNOSIS — Z3A08 8 weeks gestation of pregnancy: Secondary | ICD-10-CM | POA: Insufficient documentation

## 2018-06-08 DIAGNOSIS — Z87891 Personal history of nicotine dependence: Secondary | ICD-10-CM | POA: Insufficient documentation

## 2018-06-08 DIAGNOSIS — R109 Unspecified abdominal pain: Secondary | ICD-10-CM | POA: Insufficient documentation

## 2018-06-08 LAB — CBC
HCT: 32 % — ABNORMAL LOW (ref 36.0–46.0)
Hemoglobin: 10.7 g/dL — ABNORMAL LOW (ref 12.0–15.0)
MCH: 26.3 pg (ref 26.0–34.0)
MCHC: 33.4 g/dL (ref 30.0–36.0)
MCV: 78.6 fL (ref 78.0–100.0)
PLATELETS: 322 10*3/uL (ref 150–400)
RBC: 4.07 MIL/uL (ref 3.87–5.11)
RDW: 15.1 % (ref 11.5–15.5)
WBC: 9.6 10*3/uL (ref 4.0–10.5)

## 2018-06-08 LAB — WET PREP, GENITAL
Clue Cells Wet Prep HPF POC: NONE SEEN
Sperm: NONE SEEN
TRICH WET PREP: NONE SEEN
YEAST WET PREP: NONE SEEN

## 2018-06-08 LAB — HCG, QUANTITATIVE, PREGNANCY: hCG, Beta Chain, Quant, S: 79290 m[IU]/mL — ABNORMAL HIGH (ref <5)

## 2018-06-08 NOTE — MAU Note (Signed)
Urine in lab 

## 2018-06-08 NOTE — MAU Note (Signed)
Found out preg on Sunday.  [facility], was told she might be miscarrying because of the spotting.  Has been spotting for like 2 wks now. Some cramping the first wk, off and on, none now

## 2018-06-08 NOTE — Discharge Instructions (Signed)

## 2018-06-08 NOTE — MAU Provider Note (Signed)
Chief Complaint: Vaginal Bleeding and Possible Pregnancy   None    SUBJECTIVE HPI: Carrie Alvarado is a 27 y.o. G2P1101 at [redacted]w[redacted]d by unsure LMP who presents to [facility] reporting abdominal cramping and vaginal spotting. [facility] & [facility]. Reported her symptoms to them & was sent here for further evaluation. Reports lower abdominal cramping last week that got better this week but continues to come and go. Vaginal spotting intermittently got the last 2 weeks. Reports purple/dark red spotting when she wipes. Is not bleeding into a pad or passing clots. Last intercourse was 4 days ago. Denies vaginal discharge otherwise.   Location: lower abdomen Quality: cramping Severity: 0/10 on pain scale Duration: x2 weeks Timing: intermittent Modifying factors: none Associated signs and symptoms: vaginal spotting  Past Medical History:  Diagnosis Date  . No pertinent past medical history    OB History  Gravida Para Term Preterm AB Living  3 2 1 1  0 1  SAB TAB Ectopic Multiple Live Births  0 0 0 0 1    # Outcome Date GA Lbr Len/2nd Weight Sex Delivery Anes PTL Lv  3 Current           2 Term 03/13/12 [redacted]w[redacted]d   F CS-LTranv Spinal  LIV     Birth Comments: NONE  1 Preterm 04/13/11 [redacted]w[redacted]d   F CS-LTranv  Y FD     Birth Comments: fetal hydrops & chest mass, C/S done at [facility]. Pt states she thinks she was about 7 months pregnant   Past Surgical History:  Procedure Laterality Date  . BREAST SURGERY     Implants  . CESAREAN SECTION    . CESAREAN SECTION  03/13/2012   Procedure: CESAREAN SECTION;  Surgeon: Lavina Hamman, MD;  Location: [facility];  Service: Gynecology;  Laterality: N/A;  Repeat cesarean section with delivery of baby girl at 40.  Apgars 9/9.  Marland Kitchen OTHER SURGICAL HISTORY     Glass removed in arm   Social History   Socioeconomic History  . Marital status: Legally Separated    Spouse name: Not on file  . Number of children: Not on file   . Years of education: Not on file  . Highest education level: Not on file  Occupational History  . Not on file  Social Needs  . Financial resource strain: Not on file  . Food insecurity:    Worry: Not on file    Inability: Not on file  . Transportation needs:    Medical: Not on file    Non-medical: Not on file  Tobacco Use  . Smoking status: Former Smoker    Last attempt to quit: 05/25/2018    Years since quitting: 0.0  . Smokeless tobacco: Never Used  Substance and Sexual Activity  . Alcohol use: No    Comment: not with pregnancy  . Drug use: No    Types: Marijuana    Comment: not with pregnancy  . Sexual activity: Yes    Birth control/protection: None  Lifestyle  . Physical activity:    Days per week: Not on file    Minutes per session: Not on file  . Stress: Not on file  Relationships  . Social connections:    Talks on phone: Not on file    Gets together: Not on file    Attends religious service: Not on file    Active member of club or organization: Not on file    Attends meetings of  clubs or organizations: Not on file    Relationship status: Not on file  . Intimate partner violence:    Fear of current or ex partner: Not on file    Emotionally abused: Not on file    Physically abused: Not on file    Forced sexual activity: Not on file  Other Topics Concern  . Not on file  Social History Narrative  . Not on file   Family History  Problem Relation Age of Onset  . Hypotension Neg Hx   . Anesthesia problems Neg Hx    No current facility-administered medications on file prior to encounter.    Current Outpatient Medications on File Prior to Encounter  Medication Sig Dispense Refill  . prednisoLONE acetate (PRED FORTE) 1 % ophthalmic suspension Place 1 drop into both eyes 3 (three) times daily. 5 mL 0  . tobramycin (TOBREX) 0.3 % ophthalmic solution Place 1 drop into both eyes every 6 (six) hours. 5 mL 1   No Known Allergies  I have reviewed patient's Past  Medical Hx, Surgical Hx, Family Hx, Social Hx, medications and allergies.   Review of Systems  Constitutional: Negative.   Gastrointestinal: Positive for abdominal pain.  Genitourinary: Positive for vaginal bleeding. Negative for vaginal discharge.    OBJECTIVE Patient Vitals for the past 24 hrs:  BP Temp Temp src Pulse Resp SpO2 Height Weight  06/08/18 1327 104/61 98.7 F (37.1 C) Oral 75 16 100 % 4\' 11"  (1.499 m) 117 lb 4 oz (53.2 kg)   Constitutional: Well-developed, well-nourished female in no acute distress.  Cardiovascular: normal rate & rhythm, no murmur Respiratory: normal rate and effort. Lung sounds clear throughout GI: Abd soft, non-tender, Pos BS x 4. No guarding or rebound tenderness MS: Extremities nontender, no edema, normal ROM Neurologic: Alert and oriented x 4.  GU:     SPECULUM EXAM: NEFG, small amount of watery tan discharge, no  blood noted, cervix clean  BIMANUAL: No CMT. cervix closed; uterus normal size, no adnexal  tenderness or masses.    LAB RESULTS Results for orders placed or performed during the hospital encounter of 06/08/18 (from the past 24 hour(s))  CBC     Status: Abnormal   Collection Time: 06/08/18  1:49 PM  Result Value Ref Range   WBC 9.6 4.0 - 10.5 K/uL   RBC 4.07 3.87 - 5.11 MIL/uL   Hemoglobin 10.7 (L) 12.0 - 15.0 g/dL   HCT 16.1 (L) 09.6 - 04.5 %   MCV 78.6 78.0 - 100.0 fL   MCH 26.3 26.0 - 34.0 pg   MCHC 33.4 30.0 - 36.0 g/dL   RDW 40.9 81.1 - 91.4 %   Platelets 322 150 - 400 K/uL  hCG, quantitative, pregnancy     Status: Abnormal   Collection Time: 06/08/18  1:49 PM  Result Value Ref Range   hCG, Beta Chain, Quant, S 79,290 (H) <5 mIU/mL  Wet prep, genital     Status: Abnormal   Collection Time: 06/08/18  3:58 PM  Result Value Ref Range   Yeast Wet Prep HPF POC NONE SEEN NONE SEEN   Trich, Wet Prep NONE SEEN NONE SEEN   Clue Cells Wet Prep HPF POC NONE SEEN NONE SEEN   WBC, Wet Prep HPF POC MANY (A) NONE SEEN   Sperm NONE  SEEN     IMAGING US Ob Comp Less 14 Wks  Result Date: 06/08/2018 CLINICAL DATA:  Abdominal cramping and vaginal bleeding affecting first trimester of pregnancy  EXAM: OBSTETRIC <14 WK ULTRASOUND TECHNIQUE: Transabdominal ultrasound was performed for evaluation of the gestation as well as the maternal uterus and adnexal regions. COMPARISON:  None for this gestation FINDINGS: Intrauterine gestational sac: Present, single Yolk sac:  Present Embryo:  Present Cardiac Activity: Present Heart Rate: 140 bpm CRL:   9.4 mm   6 w 6 d                  US EDC: 01/26/2019 Subchorionic hemorrhage:  None visualized. Maternal uterus/adnexae: RIGHT ovary measures 4.0 x 2.9 x 1.9 cm, likely containing a small corpus luteum. LEFT ovary not visualized question obscured by bowel gas. No free pelvic fluid or adnexal masses. Transvaginal imaging was not performed. IMPRESSION: Single live intrauterine gestation at 6 weeks 6 days EGA by crown-rump length. Nonvisualization of LEFT ovary. No acute abnormalities. Electronically Signed   By: Ulyses SouthwardMark  Boles M.D.   On: 06/08/2018 14:21    [name] Orders Placed This Encounter  Procedures  . Wet prep, genital  . US OB Comp Less 14 Wks  . CBC  . hCG, quantitative, pregnancy  . Discharge patient   No orders of the defined types were placed in this encounter.   MDM +UPT UA, wet prep, GC/chlamydia, CBC, ABO/Rh, quant hCG, and US today to rule out ectopic pregnancy Ultrasound shows SIUP with cardiac activity No blood on exam O positive  ASSESSMENT 1. Normal IUP (intrauterine pregnancy) on prenatal ultrasound, first trimester   2. Vaginal bleeding in pregnancy, first trimester   3. Abdominal cramping affecting pregnancy     PLAN Discharge home in stable condition. Bleeding precautions  Allergies as of 06/08/2018   No Known Allergies     Medication List    STOP taking these medications   prednisoLONE acetate 1 % ophthalmic suspension Commonly known as:  PRED  FORTE   tobramycin 0.3 % ophthalmic solution Commonly known as:  Synthia InnocentBREX        Cuahutemoc Attar, NP 06/08/2018  4:29 PM

## 2018-06-10 LAB — GC/CHLAMYDIA PROBE AMP (~~LOC~~) NOT AT ARMC
CHLAMYDIA, DNA PROBE: NEGATIVE
NEISSERIA GONORRHEA: NEGATIVE

## 2018-07-13 ENCOUNTER — Encounter: Payer: Self-pay | Admitting: General Practice

## 2018-07-20 ENCOUNTER — Other Ambulatory Visit: Payer: Self-pay

## 2018-07-20 ENCOUNTER — Inpatient Hospital Stay (HOSPITAL_COMMUNITY)
Admission: AD | Admit: 2018-07-20 | Discharge: 2018-07-20 | Disposition: A | Discharge status: Home / Self Care | Payer: Self-pay | Source: Ambulatory Visit | Attending: Obstetrics & Gynecology | Admitting: Obstetrics & Gynecology

## 2018-07-20 ENCOUNTER — Encounter (HOSPITAL_COMMUNITY): Payer: Self-pay

## 2018-07-20 DIAGNOSIS — Z87891 Personal history of nicotine dependence: Secondary | ICD-10-CM | POA: Insufficient documentation

## 2018-07-20 DIAGNOSIS — Z3A12 12 weeks gestation of pregnancy: Secondary | ICD-10-CM | POA: Insufficient documentation

## 2018-07-20 DIAGNOSIS — Z6790 Unspecified blood type, Rh positive: Secondary | ICD-10-CM | POA: Insufficient documentation

## 2018-07-20 DIAGNOSIS — O209 Hemorrhage in early pregnancy, unspecified: Secondary | ICD-10-CM | POA: Insufficient documentation

## 2018-07-20 DIAGNOSIS — O4690 Antepartum hemorrhage, unspecified, unspecified trimester: Secondary | ICD-10-CM

## 2018-07-20 DIAGNOSIS — O4691 Antepartum hemorrhage, unspecified, first trimester: Secondary | ICD-10-CM

## 2018-07-20 DIAGNOSIS — O210 Mild hyperemesis gravidarum: Secondary | ICD-10-CM | POA: Insufficient documentation

## 2018-07-20 LAB — URINALYSIS, ROUTINE W REFLEX MICROSCOPIC

## 2018-07-20 LAB — COMPREHENSIVE METABOLIC PANEL
ALT: 17 U/L (ref 0–44)
AST: 12 U/L — ABNORMAL LOW (ref 15–41)
Albumin: 3.6 g/dL (ref 3.5–5.0)
Alkaline Phosphatase: 55 U/L (ref 38–126)
Anion gap: 8 (ref 5–15)
BUN: 9 mg/dL (ref 6–20)
CO2: 22 mmol/L (ref 22–32)
Calcium: 8.3 mg/dL — ABNORMAL LOW (ref 8.9–10.3)
Chloride: 105 mmol/L (ref 98–111)
Creatinine, Ser: 0.41 mg/dL — ABNORMAL LOW (ref 0.44–1.00)
GFR calc Af Amer: >60 mL/min (ref >60)
GFR calc non Af Amer: >60 mL/min (ref >60)
Glucose, Bld: 83 mg/dL (ref 70–99)
Potassium: 3.7 mmol/L (ref 3.5–5.1)
Sodium: 135 mmol/L (ref 135–145)
Total Bilirubin: 0.6 mg/dL (ref 0.3–1.2)
Total Protein: 6.9 g/dL (ref 6.5–8.1)

## 2018-07-20 LAB — CBC
HCT: 33.5 % — ABNORMAL LOW (ref 36.0–46.0)
Hemoglobin: 10.9 g/dL — ABNORMAL LOW (ref 12.0–15.0)
MCH: 25.3 pg — ABNORMAL LOW (ref 26.0–34.0)
MCHC: 32.5 g/dL (ref 30.0–36.0)
MCV: 77.9 fL — ABNORMAL LOW (ref 78.0–100.0)
Platelets: 309 10*3/uL (ref 150–400)
RBC: 4.3 MIL/uL (ref 3.87–5.11)
RDW: 14.4 % (ref 11.5–15.5)
WBC: 9.7 10*3/uL (ref 4.0–10.5)

## 2018-07-20 LAB — URINALYSIS, MICROSCOPIC (REFLEX): RBC / HPF: >50 RBC/hpf (ref 0–5)

## 2018-07-20 MED ORDER — ONDANSETRON 4 MG PO TBDP: 4.0000 mg | ORAL_TABLET | Freq: Three times a day (TID) | ORAL | 0 refills | Status: DC | PRN

## 2018-07-20 NOTE — Discharge Instructions (Signed)
Morning Sickness Morning sickness is when you feel sick to your stomach (nauseous) during pregnancy. This nauseous feeling may or may not come with vomiting. It often occurs in the morning but can be a problem any time of day. Morning sickness is most common during the first trimester, but it may continue throughout pregnancy. While morning sickness is unpleasant, it is usually harmless unless you develop severe and continual vomiting (hyperemesis gravidarum). This condition requires more intense treatment. What are the causes? The cause of morning sickness is not completely known but seems to be related to normal hormonal changes that occur in pregnancy. What increases the risk? You are at greater risk if you:  Experienced nausea or vomiting before your pregnancy.  Had morning sickness during a previous pregnancy.  Are pregnant with more than one baby, such as twins.  How is this treated? Do not use any medicines (prescription, over-the-counter, or herbal) for morning sickness without first talking to your health care provider. Your health care provider may prescribe or recommend:  Vitamin B6 supplements.  Anti-nausea medicines.  The herbal medicine ginger.  Follow these instructions at home:  Only take over-the-counter or prescription medicines as directed by your health care provider.  Taking multivitamins before getting pregnant can prevent or decrease the severity of morning sickness in most women.  Eat a piece of dry toast or unsalted crackers before getting out of bed in the morning.  Eat five or six small meals a day.  Eat dry and bland foods (rice, baked potato). Foods high in carbohydrates are often helpful.  Do not drink liquids with your meals. Drink liquids between meals.  Avoid greasy, fatty, and spicy foods.  Get someone to cook for you if the smell of any food causes nausea and vomiting.  If you feel nauseous after taking prenatal vitamins, take the vitamins at  night or with a snack.  Snack on protein foods (nuts, yogurt, cheese) between meals if you are hungry.  Eat unsweetened gelatins for desserts.  Wearing an acupressure wristband (worn for sea sickness) may be helpful.  Acupuncture may be helpful.  Do not smoke.  Get a humidifier to keep the air in your house free of odors.  Get plenty of fresh air. Contact a health care provider if:  Your home remedies are not working, and you need medicine.  You feel dizzy or lightheaded.  You are losing weight. Get help right away if:  You have persistent and uncontrolled nausea and vomiting.  You pass out (faint). This information is not intended to replace advice given to you by your health care provider. Make sure you discuss any questions you have with your health care provider. Document Released: 12/17/2006 Document Revised: 04/02/2016 Document Reviewed: 04/12/2013 Elsevier Interactive Patient Education  2017 [employer]   Vaginal Bleeding During Pregnancy, First Trimester A small amount of bleeding (spotting) from the vagina is common in early pregnancy. Sometimes the bleeding is normal and is not a problem, and sometimes it is a sign of something serious. Be sure to tell your doctor about any bleeding from your vagina right away. Follow these instructions at home:  Watch your condition for any changes.  Follow your doctor's instructions about how active you can be.  If you are on bed rest: ? You may need to stay in bed and only get up to use the bathroom. ? You may be allowed to do some activities. ? If you need help, make plans for someone to help you.  Write down: ? The number of pads you use each day. ? How often you change pads. ? How soaked (saturated) your pads are.  Do not use tampons.  Do not douche.  Do not have sex or orgasms until your doctor says it is okay.  If you pass any tissue from your vagina, save the tissue so you can show it to your  doctor.  Only take medicines as told by your doctor.  Do not take aspirin because it can make you bleed.  Keep all follow-up visits as told by your doctor. Contact a doctor if:  You bleed from your vagina.  You have cramps.  You have labor pains.  You have a fever that does not go away after you take medicine. Get help right away if:  You have very bad cramps in your back or belly (abdomen).  You pass large clots or tissue from your vagina.  You bleed more.  You feel light-headed or weak.  You pass out (faint).  You have chills.  You are leaking fluid or have a gush of fluid from your vagina.  You pass out while pooping (having a bowel movement). This information is not intended to replace advice given to you by your health care provider. Make sure you discuss any questions you have with your health care provider. Document Released: 03/12/2014 Document Revised: 04/02/2016 Document Reviewed: 07/03/2013 Elsevier Interactive Patient Education  [employer].

## 2018-07-20 NOTE — MAU Provider Note (Signed)
History   Carrie Alvarado is a 27 year old G30P1101 female that is 12 weeks 6 days gestation. She present with a chief complaint of vaginal bleeding and cramping. This bleeding started this morning at approximately 0830 and she came immediately to MAU. Before the bleeding she states she had white vaginal discharge and pruritis intermittently. Patient states that the cramping started around 0100 this morning but she did not rate it as severe. She also reports that she has had nausea and vomiting since her pregnancy began. She states the vomits after most meals around has vomited once today. She reports back pain and dizziness when standing. Patient denies dysuria, abdominal pain, hematuria.She had a workup for vaginal bleeding on 06/19/18 which confirmed IUP via ultrasound, urine pregnancy, and beta hcg. She states that this is the first bleeding she has had since that visit. She denies new sexual partners, STI history, and risk for STI contraction. She states that she last had intercourse 1 week ago.  CSN: [id number]  Arrival date and time: 07/20/18 0856   None     Chief Complaint  Patient presents with  . Vaginal Bleeding      Past Medical History:  Diagnosis Date  . No pertinent past medical history     Past Surgical History:  Procedure Laterality Date  . BREAST SURGERY     Implants  . CESAREAN SECTION    . CESAREAN SECTION  03/13/2012   Procedure: CESAREAN SECTION;  Surgeon: Lavina Hamman, MD;  Location: [facility];  Service: Gynecology;  Laterality: N/A;  Repeat cesarean section with delivery of baby girl at 6.  Apgars 9/9.  Marland Kitchen OTHER SURGICAL HISTORY     Glass removed in arm    Family History  Problem Relation Age of Onset  . Hypotension Neg Hx   . Anesthesia problems Neg Hx     Social History   Tobacco Use  . Smoking status: Former Smoker    Last attempt to quit: 05/25/2018    Years since quitting: 0.1  . Smokeless tobacco: Never Used  Substance Use Topics  . Alcohol use:  No    Comment: not with pregnancy  . Drug use: No    Types: Marijuana    Comment: not with pregnancy    Allergies: No Known Allergies  No medications prior to admission.    Review of Systems Physical Exam   Blood pressure 113/75, pulse 91, temperature 97.8 F (36.6 C), temperature source Oral, resp. rate 20, height 5' (1.524 m), weight 56 kg, last menstrual period 04/10/2018, SpO2 100 %.  Physical Exam  Constitutional: She appears well-developed and well-nourished.  GI: There is no tenderness.  Genitourinary: There is bleeding (watery, brown tinged blood) in the vagina.   Results for orders placed or performed during the hospital encounter of 07/20/18 (from the past 24 hour(s))  Urinalysis, Routine w reflex microscopic     Status: Abnormal   Collection Time: 07/20/18  9:37 AM  Result Value Ref Range   Color, Urine RED (A) YELLOW   APPearance TURBID (A) CLEAR   Specific Gravity, Urine  1.005 - 1.030    TEST NOT REPORTED DUE TO COLOR INTERFERENCE OF URINE PIGMENT   pH  5.0 - 8.0    TEST NOT REPORTED DUE TO COLOR INTERFERENCE OF URINE PIGMENT   Glucose, UA (A) NEGATIVE mg/dL    TEST NOT REPORTED DUE TO COLOR INTERFERENCE OF URINE PIGMENT   Hgb urine dipstick (A) NEGATIVE    TEST NOT REPORTED  DUE TO COLOR INTERFERENCE OF URINE PIGMENT   Bilirubin Urine (A) NEGATIVE    TEST NOT REPORTED DUE TO COLOR INTERFERENCE OF URINE PIGMENT   Ketones, ur (A) NEGATIVE mg/dL    TEST NOT REPORTED DUE TO COLOR INTERFERENCE OF URINE PIGMENT   Protein, ur (A) NEGATIVE mg/dL    TEST NOT REPORTED DUE TO COLOR INTERFERENCE OF URINE PIGMENT   Nitrite (A) NEGATIVE    TEST NOT REPORTED DUE TO COLOR INTERFERENCE OF URINE PIGMENT   Leukocytes, UA (A) NEGATIVE    TEST NOT REPORTED DUE TO COLOR INTERFERENCE OF URINE PIGMENT  Urinalysis, Microscopic (reflex)     Status: Abnormal   Collection Time: 07/20/18  9:37 AM  Result Value Ref Range   RBC / HPF >50 0 - 5 RBC/hpf   WBC, UA 0-5 0 - 5 WBC/hpf    Bacteria, UA FEW (A) NONE SEEN   Squamous Epithelial / LPF 6-10 0 - 5   Urine-Other YEAST   CBC     Status: Abnormal   Collection Time: 07/20/18 10:12 AM  Result Value Ref Range   WBC 9.7 4.0 - 10.5 K/uL   RBC 4.30 3.87 - 5.11 MIL/uL   Hemoglobin 10.9 (L) 12.0 - 15.0 g/dL   HCT 16.1 (L) 09.6 - 04.5 %   MCV 77.9 (L) 78.0 - 100.0 fL   MCH 25.3 (L) 26.0 - 34.0 pg   MCHC 32.5 30.0 - 36.0 g/dL   RDW 40.9 81.1 - 91.4 %   Platelets 309 150 - 400 K/uL  Comprehensive metabolic panel     Status: Abnormal   Collection Time: 07/20/18 10:12 AM  Result Value Ref Range   Sodium 135 135 - 145 mmol/L   Potassium 3.7 3.5 - 5.1 mmol/L   Chloride 105 98 - 111 mmol/L   CO2 22 22 - 32 mmol/L   Glucose, Bld 83 70 - 99 mg/dL   BUN 9 6 - 20 mg/dL   Creatinine, Ser 7.82 (L) 0.44 - 1.00 mg/dL   Calcium 8.3 (L) 8.9 - 10.3 mg/dL   Total Protein 6.9 6.5 - 8.1 g/dL   Albumin 3.6 3.5 - 5.0 g/dL   AST 12 (L) 15 - 41 U/L   ALT 17 0 - 44 U/L   Alkaline Phosphatase 55 38 - 126 U/L   Total Bilirubin 0.6 0.3 - 1.2 mg/dL   GFR calc non Af Amer >60 >60 mL/min   GFR calc Af Amer >60 >60 mL/min   Anion gap 8 5 - 15   [name]  Procedures  MDM Labs were ordered and reviewed. Suspected subchorionic hemorrhage due to bleeding. Miscarriage and ectopic pregnancy unlikely due to doppler fetal tones and fetal movement on ultrasound.   Assessment and Plan  [redacted] weeks gestation of pregnancy - Plan: Discharge patient  Vaginal bleeding in pregnancy - Plan: Discharge patient  Blood type, Rh positive - Plan: Discharge patient  Morning sickness - Plan: Discharge patient  Patient was discharged to home. She was prescribed Zofran PRN for nausea. She states that she will get prenantal vitamins and start taking them as soon as possible. Discussed setting up prenatal care. She is currently exploring her options for insurance with [employer] and states she will look at [employer]. She was instructed to be  aware that spotting or bleeding may continue for a few days to weeks. If pain worsens or if bleeding is heavy enough to saturate more than 1 pad an hour, she  was told to come back to MAU.   Charyl Dancer 07/20/2018, 10:39 AM

## 2018-07-20 NOTE — MAU Provider Note (Signed)
History     CSN: [id number]  Arrival date and time: 07/20/18 0856  Chief Complaint  Patient presents with  . Vaginal Bleeding   G3P1101 @12 .6 wks here with VB and cramping. Sx started this am around 0830. Reports white discharge prior to bleeding. No itching or odor. No new partner. Recent STD screen neg. She had a few cramps at the time, none now. No recent IC. No urinary sx. Endorses daily N/V. Not taking anything for it. Had episode of VB about 1 mo ago and workup confirmed viable IUP.   OB History    Gravida  3   Para  2   Term  1   Preterm  1   AB  0   Living  1     SAB  0   TAB  0   Ectopic  0   Multiple  0   Live Births  1           Past Medical History:  Diagnosis Date  . No pertinent past medical history     Past Surgical History:  Procedure Laterality Date  . BREAST SURGERY     Implants  . CESAREAN SECTION    . CESAREAN SECTION  03/13/2012   Procedure: CESAREAN SECTION;  Surgeon: Lavina Hamman, MD;  Location: [facility];  Service: Gynecology;  Laterality: N/A;  Repeat cesarean section with delivery of baby girl at 30.  Apgars 9/9.  Marland Kitchen OTHER SURGICAL HISTORY     Glass removed in arm    Family History  Problem Relation Age of Onset  . Hypotension Neg Hx   . Anesthesia problems Neg Hx     Social History   Tobacco Use  . Smoking status: Former Smoker    Last attempt to quit: 05/25/2018    Years since quitting: 0.1  . Smokeless tobacco: Never Used  Substance Use Topics  . Alcohol use: No    Comment: not with pregnancy  . Drug use: No    Types: Marijuana    Comment: not with pregnancy    Allergies: No Known Allergies  No medications prior to admission.    Review of Systems  Gastrointestinal: Positive for abdominal pain, nausea and vomiting.  Genitourinary: Positive for vaginal bleeding and vaginal discharge. Negative for dysuria.  Musculoskeletal: Positive for back pain.   Physical Exam   Blood pressure 113/75, pulse 91,  temperature 97.8 F (36.6 C), temperature source Oral, resp. rate 20, height 5' (1.524 m), weight 56 kg, last menstrual period 04/10/2018, SpO2 100 %.  Physical Exam  Constitutional: She is oriented to person, place, and time. She appears well-developed and well-nourished. No distress.  HENT:  Head: Normocephalic and atraumatic.  Neck: Normal range of motion.  Respiratory: Effort normal. No respiratory distress.  GI: Soft. She exhibits no distension and no mass. There is no tenderness. There is no rebound and no guarding.  Genitourinary:  Genitourinary Comments: External: no lesions or erythema Vagina: rugated, pink, moist, small drk bloody discharge- cleared with 1 fox swab Cervix closed/long   Musculoskeletal: Normal range of motion.  Neurological: She is alert and oriented to person, place, and time.  Skin: Skin is warm and dry.  Psychiatric: She has a normal mood and affect.  FHT 153  Limited bedside US: viable, active fetus, +cardiac activity, subj. nml AFV  Results for orders placed or performed during the hospital encounter of 07/20/18 (from the past 24 hour(s))  Urinalysis, Routine w reflex microscopic  Status: Abnormal   Collection Time: 07/20/18  9:37 AM  Result Value Ref Range   Color, Urine RED (A) YELLOW   APPearance TURBID (A) CLEAR   Specific Gravity, Urine  1.005 - 1.030    TEST NOT REPORTED DUE TO COLOR INTERFERENCE OF URINE PIGMENT   pH  5.0 - 8.0    TEST NOT REPORTED DUE TO COLOR INTERFERENCE OF URINE PIGMENT   Glucose, UA (A) NEGATIVE mg/dL    TEST NOT REPORTED DUE TO COLOR INTERFERENCE OF URINE PIGMENT   Hgb urine dipstick (A) NEGATIVE    TEST NOT REPORTED DUE TO COLOR INTERFERENCE OF URINE PIGMENT   Bilirubin Urine (A) NEGATIVE    TEST NOT REPORTED DUE TO COLOR INTERFERENCE OF URINE PIGMENT   Ketones, ur (A) NEGATIVE mg/dL    TEST NOT REPORTED DUE TO COLOR INTERFERENCE OF URINE PIGMENT   Protein, ur (A) NEGATIVE mg/dL    TEST NOT REPORTED DUE TO  COLOR INTERFERENCE OF URINE PIGMENT   Nitrite (A) NEGATIVE    TEST NOT REPORTED DUE TO COLOR INTERFERENCE OF URINE PIGMENT   Leukocytes, UA (A) NEGATIVE    TEST NOT REPORTED DUE TO COLOR INTERFERENCE OF URINE PIGMENT  Urinalysis, Microscopic (reflex)     Status: Abnormal   Collection Time: 07/20/18  9:37 AM  Result Value Ref Range   RBC / HPF >50 0 - 5 RBC/hpf   WBC, UA 0-5 0 - 5 WBC/hpf   Bacteria, UA FEW (A) NONE SEEN   Squamous Epithelial / LPF 6-10 0 - 5   Urine-Other YEAST   CBC     Status: Abnormal   Collection Time: 07/20/18 10:12 AM  Result Value Ref Range   WBC 9.7 4.0 - 10.5 K/uL   RBC 4.30 3.87 - 5.11 MIL/uL   Hemoglobin 10.9 (L) 12.0 - 15.0 g/dL   HCT 84.1 (L) 32.4 - 40.1 %   MCV 77.9 (L) 78.0 - 100.0 fL   MCH 25.3 (L) 26.0 - 34.0 pg   MCHC 32.5 30.0 - 36.0 g/dL   RDW 02.7 25.3 - 66.4 %   Platelets 309 150 - 400 K/uL  Comprehensive metabolic panel     Status: Abnormal   Collection Time: 07/20/18 10:12 AM  Result Value Ref Range   Sodium 135 135 - 145 mmol/L   Potassium 3.7 3.5 - 5.1 mmol/L   Chloride 105 98 - 111 mmol/L   CO2 22 22 - 32 mmol/L   Glucose, Bld 83 70 - 99 mg/dL   BUN 9 6 - 20 mg/dL   Creatinine, Ser 4.03 (L) 0.44 - 1.00 mg/dL   Calcium 8.3 (L) 8.9 - 10.3 mg/dL   Total Protein 6.9 6.5 - 8.1 g/dL   Albumin 3.6 3.5 - 5.0 g/dL   AST 12 (L) 15 - 41 U/L   ALT 17 0 - 44 U/L   Alkaline Phosphatase 55 38 - 126 U/L   Total Bilirubin 0.6 0.3 - 1.2 mg/dL   GFR calc non Af Amer >60 >60 mL/min   GFR calc Af Amer >60 >60 mL/min   Anion gap 8 5 - 15   [name]  Procedures  MDM Labs ordered and reviewed. Suspect [facility] but not seen on [place]. Discussed SAB/bleeding precautions. Will treat N/V with Zofran-warned about constipation. Recommend start care soon. Stable for discharge home.   Assessment and Plan   1. [redacted] weeks gestation of pregnancy   2. Vaginal bleeding in pregnancy   3. Blood type, Rh  positive   4. Morning sickness    Discharge home Follow up  with OB provider of choice SAB/bleeding precautions Rx Zofran  Allergies as of 07/20/2018   No Known Allergies     Medication List    TAKE these medications   ondansetron 4 MG disintegrating tablet Commonly known as:  ZOFRAN-ODT Take 1 tablet (4 mg total) by mouth every 8 (eight) hours as needed for nausea or vomiting.      Donette Larry, CNM 07/20/2018, 11:14 AM

## 2018-07-20 NOTE — MAU Note (Signed)
Pt states vaginal bleeding, leaking through her pants, that started at 0830 07/20/18. She says bleeding is comparable amount during menstruation. She also states mild cramping starting before bleeding around 0100 07/20/18.

## 2018-07-20 NOTE — Progress Notes (Signed)
Bedside U/S done by M. Denyse Amass, CNM.

## 2018-08-10 ENCOUNTER — Ambulatory Visit (INDEPENDENT_AMBULATORY_CARE_PROVIDER_SITE_OTHER): Payer: Self-pay | Admitting: Physician Assistant

## 2018-08-12 ENCOUNTER — Other Ambulatory Visit (HOSPITAL_COMMUNITY)
Admission: RE | Admit: 2018-08-12 | Discharge: 2018-08-12 | Disposition: A | Discharge status: Home / Self Care | Payer: Medicaid Other | Source: Ambulatory Visit | Attending: Family | Admitting: Family

## 2018-08-12 ENCOUNTER — Ambulatory Visit (INDEPENDENT_AMBULATORY_CARE_PROVIDER_SITE_OTHER): Payer: Medicaid Other | Admitting: Family

## 2018-08-12 ENCOUNTER — Encounter: Payer: Self-pay | Admitting: Family

## 2018-08-12 VITALS — BP 101/61 | HR 86 | Wt 128.4 lb

## 2018-08-12 DIAGNOSIS — O99012 Anemia complicating pregnancy, second trimester: Secondary | ICD-10-CM

## 2018-08-12 DIAGNOSIS — O09292 Supervision of pregnancy with other poor reproductive or obstetric history, second trimester: Secondary | ICD-10-CM | POA: Diagnosis not present

## 2018-08-12 DIAGNOSIS — O0992 Supervision of high risk pregnancy, unspecified, second trimester: Secondary | ICD-10-CM | POA: Diagnosis not present

## 2018-08-12 DIAGNOSIS — Z3A16 16 weeks gestation of pregnancy: Secondary | ICD-10-CM | POA: Insufficient documentation

## 2018-08-12 DIAGNOSIS — O0990 Supervision of high risk pregnancy, unspecified, unspecified trimester: Secondary | ICD-10-CM

## 2018-08-12 DIAGNOSIS — Z3480 Encounter for supervision of other normal pregnancy, unspecified trimester: Secondary | ICD-10-CM

## 2018-08-12 DIAGNOSIS — O09299 Supervision of pregnancy with other poor reproductive or obstetric history, unspecified trimester: Secondary | ICD-10-CM

## 2018-08-12 DIAGNOSIS — Z3482 Encounter for supervision of other normal pregnancy, second trimester: Secondary | ICD-10-CM | POA: Diagnosis not present

## 2018-08-12 DIAGNOSIS — Z363 Encounter for antenatal screening for malformations: Secondary | ICD-10-CM

## 2018-08-12 DIAGNOSIS — Z3A19 19 weeks gestation of pregnancy: Secondary | ICD-10-CM | POA: Diagnosis not present

## 2018-08-12 DIAGNOSIS — O09899 Supervision of other high risk pregnancies, unspecified trimester: Secondary | ICD-10-CM | POA: Insufficient documentation

## 2018-08-12 DIAGNOSIS — O09291 Supervision of pregnancy with other poor reproductive or obstetric history, first trimester: Secondary | ICD-10-CM

## 2018-08-12 DIAGNOSIS — Z23 Encounter for immunization: Secondary | ICD-10-CM | POA: Diagnosis not present

## 2018-08-12 DIAGNOSIS — O34219 Maternal care for unspecified type scar from previous cesarean delivery: Secondary | ICD-10-CM

## 2018-08-12 DIAGNOSIS — O09219 Supervision of pregnancy with history of pre-term labor, unspecified trimester: Secondary | ICD-10-CM

## 2018-08-12 DIAGNOSIS — O0991 Supervision of high risk pregnancy, unspecified, first trimester: Secondary | ICD-10-CM

## 2018-08-12 HISTORY — DX: Supervision of high risk pregnancy, unspecified, unspecified trimester: O09.90

## 2018-08-12 NOTE — Progress Notes (Signed)
Subjective:    Carrie Alvarado is a [id number] [redacted]w[redacted]d being seen today for her first obstetrical visit.  Her obstetrical history is significant for 1st prior pregnancy affected by fetal anomalies; possible PPROM at 28 weeks.  Pt reports decreased fetal hear rate and stat csection.  Uncertainty around PPROM.  Following pregnancy was a term repeat csection at 38 weeks.   Patient strongly desires trial of labor.  Patient does intend to breast feed. Pregnancy history fully reviewed.  Patient reports no complaints.  Experienced vaginal bleeding in early pregnancy and that's when pregnancy was determined.  Desired pregnancy much later.  In an off and on relationship with FOB.  Started dating as a teenager.    Vitals:   08/12/18 0936  BP: 101/61  Pulse: 86  Weight: 128 lb 6.4 oz (58.2 kg)    HISTORY: OB History  Gravida Para Term Preterm AB Living  3 2 1 1  0 1  SAB TAB Ectopic Multiple Live Births  0 0 0 0 1    # Outcome Date GA Lbr Len/2nd Weight Sex Delivery Anes PTL Lv  3 Current           2 Term 03/13/12 [redacted]w[redacted]d  6 lb 3 oz (2.807 kg) F CS-LTranv Spinal N LIV     Birth Comments: NONE  1 Preterm 04/13/11 [redacted]w[redacted]d   F CS-LTranv  Y FD     Birth Comments: fetal hydrops & chest mass, C/S done at [facility]. Pt states she thinks she was about 7 months pregnant   Past Medical History:  Diagnosis Date  . No pertinent past medical history    Past Surgical History:  Procedure Laterality Date  . BREAST SURGERY     Implants  . CESAREAN SECTION    . CESAREAN SECTION  03/13/2012   Procedure: CESAREAN SECTION;  Surgeon: Lavina Hamman, MD;  Location: [facility];  Service: Gynecology;  Laterality: N/A;  Repeat cesarean section with delivery of baby girl at 91.  Apgars 9/9.  Marland Kitchen OTHER SURGICAL HISTORY     Glass removed in arm   Family History  Problem Relation Age of Onset  . Hypotension Neg Hx   . Anesthesia problems Neg Hx      Exam    BP 101/61   Pulse 86   Wt 128 lb 6.4 oz (58.2 kg)   LMP  04/10/2018 (LMP Unknown)   BMI 25.08 kg/m  Uterine Size: size equals dates  Pelvic Exam:    Perineum: No Hemorrhoids, Normal Perineum   Vulva: normal   Vagina:  normal mucosa, normal discharge, no palpable nodules   pH: Not done   Cervix: no bleeding following Pap, no cervical motion tenderness and no lesions   Adnexa: normal adnexa and no mass, fullness, tenderness   Bony Pelvis: Adequate  System: Breast:  No nipple retraction or dimpling, No nipple discharge or bleeding, No axillary or supraclavicular adenopathy, Normal to palpation without dominant masses; implants intact and palpated   Skin: normal coloration and turgor, no rashes; multiple tatoos    Neurologic: negative   Extremities: normal strength, tone, and muscle mass   HEENT neck supple with midline trachea and thyroid without masses   Mouth/Teeth mucous membranes moist, pharynx normal without lesions   Neck supple and no masses   Cardiovascular: regular rate and rhythm, no murmurs or gallops   Respiratory:  appears well, vitals normal, no respiratory distress, acyanotic, normal RR, neck free of mass or lymphadenopathy, chest clear, no wheezing, crepitations, rhonchi, normal  symmetric air entry   Abdomen: soft, non-tender; bowel sounds normal; no masses,  no organomegaly   Urinary: urethral meatus normal     Assessment:    Pregnancy: [id number] Patient Active Problem List   Diagnosis Date Noted  . Supervision of high risk pregnancy, antepartum 08/12/2018  . History of preterm premature rupture of membranes (PROM) in previous pregnancy, currently pregnant, unspecified trimester 08/12/2018  . History of cesarean delivery affecting pregnancy 03/16/2012  . Vaginal bleeding in pregnancy 09/30/2011     Plan:     Initial labs drawn. Prenatal vitamins. May do 17-p; will need to obtain records for review to determine if PPROM or signs of labor before fetal distress diagnsed.   Problem list reviewed and updated. Genetic  Screening discussed, referred to genetic counseling due to history of anomalies  Ultrasound discussed; fetal survey: ordered.  Follow up in 2 weeks.  Eino Farber Elenora Fender 08/12/2018

## 2018-08-12 NOTE — Patient Instructions (Addendum)
Influenza (Flu) Vaccine (Inactivated or Recombinant): What You Need to Know 1. Why get vaccinated? Influenza ("flu") is a contagious disease that spreads around the [place] every year, usually between October and May. Flu is caused by influenza viruses, and is spread mainly by coughing, sneezing, and close contact. Anyone can get flu. Flu strikes suddenly and can last several days. Symptoms vary by age, but can include:  fever/chills  sore throat  muscle aches  fatigue  cough  headache  runny or stuffy nose  Flu can also lead to pneumonia and blood infections, and cause diarrhea and seizures in children. If you have a medical condition, such as heart or lung disease, flu can make it worse. Flu is more dangerous for some people. Infants and young children, people 65 years of age and older, pregnant women, and people with certain health conditions or a weakened immune system are at greatest risk. Each year thousands of people in the [place] die from flu, and many more are hospitalized. Flu vaccine can:  keep you from getting flu,  make flu less severe if you do get it, and  keep you from spreading flu to your family and other people. 2. Inactivated and recombinant flu vaccines A dose of flu vaccine is recommended every flu season. Children 6 months through 8 years of age may need two doses during the same flu season. Everyone else needs only one dose each flu season. Some inactivated flu vaccines contain a very small amount of a mercury-based preservative called thimerosal. Studies have not shown thimerosal in vaccines to be harmful, but flu vaccines that do not contain thimerosal are available. There is no live flu virus in flu shots. They cannot cause the flu. There are many flu viruses, and they are always changing. Each year a new flu vaccine is made to protect against three or four viruses that are likely to cause disease in the upcoming flu season. But even when the  vaccine doesn't exactly match these viruses, it may still provide some protection. Flu vaccine cannot prevent:  flu that is caused by a virus not covered by the vaccine, or  illnesses that look like flu but are not.  It takes about 2 weeks for protection to develop after vaccination, and protection lasts through the flu season. 3. Some people should not get this vaccine Tell the person who is giving you the vaccine:  If you have any severe, life-threatening allergies. If you ever had a life-threatening allergic reaction after a dose of flu vaccine, or have a severe allergy to any part of this vaccine, you may be advised not to get vaccinated. Most, but not all, types of flu vaccine contain a small amount of egg protein.  If you ever had Guillain-Barr Syndrome (also called GBS). Some people with a history of GBS should not get this vaccine. This should be discussed with your doctor.  If you are not feeling well. It is usually okay to get flu vaccine when you have a mild illness, but you might be asked to come back when you feel better.  4. Risks of a vaccine reaction With any medicine, including vaccines, there is a chance of reactions. These are usually mild and go away on their own, but serious reactions are also possible. Most people who get a flu shot do not have any problems with it. Minor problems following a flu shot include:  soreness, redness, or swelling where the shot was given  hoarseness  sore,   red or itchy eyes  cough  fever  aches  headache  itching  fatigue  If these problems occur, they usually begin soon after the shot and last 1 or 2 days. More serious problems following a flu shot can include the following:  There may be a small increased risk of Guillain-Barre Syndrome (GBS) after inactivated flu vaccine. This risk has been estimated at 1 or 2 additional cases per million people vaccinated. This is much lower than the risk of severe complications from  flu, which can be prevented by flu vaccine.  Young children who get the flu shot along with pneumococcal vaccine (PCV13) and/or DTaP vaccine at the same time might be slightly more likely to have a seizure caused by fever. Ask your doctor for more information. Tell your doctor if a child who is getting flu vaccine has ever had a seizure.  Problems that could happen after any injected vaccine:  People sometimes faint after a medical procedure, including vaccination. Sitting or lying down for about 15 minutes can help prevent fainting, and injuries caused by a fall. Tell your doctor if you feel dizzy, or have vision changes or ringing in the ears.  Some people get severe pain in the shoulder and have difficulty moving the arm where a shot was given. This happens very rarely.  Any medication can cause a severe allergic reaction. Such reactions from a vaccine are very rare, estimated at about 1 in a million doses, and would happen within a few minutes to a few hours after the vaccination. As with any medicine, there is a very remote chance of a vaccine causing a serious injury or death. The safety of vaccines is always being monitored. For more information, visit: www.cdc.gov/vaccinesafety/ 5. What if there is a serious reaction? What should I look for? Look for anything that concerns you, such as signs of a severe allergic reaction, very high fever, or unusual behavior. Signs of a severe allergic reaction can include hives, swelling of the face and throat, difficulty breathing, a fast heartbeat, dizziness, and weakness. These would start a few minutes to a few hours after the vaccination. What should I do?  If you think it is a severe allergic reaction or other emergency that can't wait, call 9-1-1 and get the person to the nearest hospital. Otherwise, call your doctor.  Reactions should be reported to the Vaccine Adverse Event Reporting System (VAERS). Your doctor should file this report, or you  can do it yourself through the VAERS web site at www.vaers.hhs.gov, or by calling 1-800-822-7967. ? VAERS does not give medical advice. 6. The National Vaccine Injury Compensation Program The National Vaccine Injury Compensation Program (VICP) is a federal program that was created to compensate people who may have been injured by certain vaccines. Persons who believe they may have been injured by a vaccine can learn about the program and about filing a claim by calling 1-800-338-2382 or visiting the VICP website at www.hrsa.gov/vaccinecompensation. There is a time limit to file a claim for compensation. 7. How can I learn more?  Ask your healthcare provider. He or she can give you the vaccine package insert or suggest other sources of information.  Call your local or state health department.  Contact the Centers for Disease Control and Prevention (CDC): ? Call 1-800-232-4636 (1-800-CDC-INFO) or ? Visit CDC's website at www.cdc.gov/flu Vaccine Information Statement, Inactivated Influenza Vaccine (06/15/2014) This information is not intended to replace advice given to you by your health care provider. Make sure   sure you discuss any questions you have with your health care provider. Document Released: 08/20/2006 Document Revised: 07/16/2016 Document Reviewed: 07/16/2016 Elsevier Interactive Patient Education  2017 [employer].   Second Trimester of Pregnancy The second trimester is from week 14 through week 27 (months 4 through 6). The second trimester is often a time when you feel your best. Your body has adjusted to being pregnant, and you begin to feel better physically. Usually, morning sickness has lessened or quit completely, you may have more energy, and you may have an increase in appetite. The second trimester is also a time when the fetus is growing rapidly. At the end of the sixth month, the fetus is about 9 inches long and weighs about 1 pounds. You will likely begin to feel the baby  move (quickening) between 16 and 20 weeks of pregnancy. Body changes during your second trimester Your body continues to go through many changes during your second trimester. The changes vary from woman to woman.  Your weight will continue to increase. You will notice your lower abdomen bulging out.  You may begin to get stretch marks on your hips, abdomen, and breasts.  You may develop headaches that can be relieved by medicines. The medicines should be approved by your health care provider.  You may urinate more often because the fetus is pressing on your bladder.  You may develop or continue to have heartburn as a result of your pregnancy.  You may develop constipation because certain hormones are causing the muscles that push waste through your intestines to slow down.  You may develop hemorrhoids or swollen, bulging veins (varicose veins).  You may have back pain. This is caused by: ? Weight gain. ? Pregnancy hormones that are relaxing the joints in your pelvis. ? A shift in weight and the muscles that support your balance.  Your breasts will continue to grow and they will continue to become tender.  Your gums may bleed and may be sensitive to brushing and flossing.  Dark spots or blotches (chloasma, mask of pregnancy) may develop on your face. This will likely fade after the baby is born.  A dark line from your belly button to the pubic area (linea nigra) may appear. This will likely fade after the baby is born.  You may have changes in your hair. These can include thickening of your hair, rapid growth, and changes in texture. Some women also have hair loss during or after pregnancy, or hair that feels dry or thin. Your hair will most likely return to normal after your baby is born.  What to expect at prenatal visits During a routine prenatal visit:  You will be weighed to make sure you and the fetus are growing normally.  Your blood pressure will be taken.  Your abdomen  will be measured to track your baby's growth.  The fetal heartbeat will be listened to.  Any test results from the previous visit will be discussed.  Your health care provider may ask you:  How you are feeling.  If you are feeling the baby move.  If you have had any abnormal symptoms, such as leaking fluid, bleeding, severe headaches, or abdominal cramping.  If you are using any tobacco products, including cigarettes, chewing tobacco, and electronic cigarettes.  If you have any questions.  Other tests that may be performed during your second trimester include:  Blood tests that check for: ? Low iron levels (anemia). ? High blood sugar that affects pregnant women (  gestational diabetes) between 14 and 28 weeks. ? Rh antibodies. This is to check for a protein on red blood cells (Rh factor).  Urine tests to check for infections, diabetes, or protein in the urine.  An ultrasound to confirm the proper growth and development of the baby.  An amniocentesis to check for possible genetic problems.  Fetal screens for spina bifida and Down syndrome.  HIV (human immunodeficiency virus) testing. Routine prenatal testing includes screening for HIV, unless you choose not to have this test.  Follow these instructions at home: Medicines  Follow your health care provider's instructions regarding medicine use. Specific medicines may be either safe or unsafe to take during pregnancy.  Take a prenatal vitamin that contains at least 600 micrograms (mcg) of folic acid.  If you develop constipation, try taking a stool softener if your health care provider approves. Eating and drinking  Eat a balanced diet that includes fresh fruits and vegetables, whole grains, good sources of protein such as meat, eggs, or tofu, and low-fat dairy. Your health care provider will help you determine the amount of weight gain that is right for you.  Avoid raw meat and uncooked cheese. These carry germs that can  cause birth defects in the baby.  If you have low calcium intake from food, talk to your health care provider about whether you should take a daily calcium supplement.  Limit foods that are high in fat and processed sugars, such as fried and sweet foods.  To prevent constipation: ? Drink enough fluid to keep your urine clear or pale yellow. ? Eat foods that are high in fiber, such as fresh fruits and vegetables, whole grains, and beans. Activity  Exercise only as directed by your health care provider. Most women can continue their usual exercise routine during pregnancy. Try to exercise for 30 minutes at least 5 days a week. Stop exercising if you experience uterine contractions.  Avoid heavy lifting, wear low heel shoes, and practice good posture.  A sexual relationship may be continued unless your health care provider directs you otherwise. Relieving pain and discomfort  Wear a good support bra to prevent discomfort from breast tenderness.  Take warm sitz baths to soothe any pain or discomfort caused by hemorrhoids. Use hemorrhoid cream if your health care provider approves.  Rest with your legs elevated if you have leg cramps or low back pain.  If you develop varicose veins, wear support hose. Elevate your feet for 15 minutes, 3-4 times a day. Limit salt in your diet. Prenatal Care  Write down your questions. Take them to your prenatal visits.  Keep all your prenatal visits as told by your health care provider. This is important. Safety  Wear your seat belt at all times when driving.  Make a list of emergency phone numbers, including numbers for family, friends, the hospital, and police and fire departments. General instructions  Ask your health care provider for a referral to a local prenatal education class. Begin classes no later than the beginning of month 6 of your pregnancy.  Ask for help if you have counseling or nutritional needs during pregnancy. Your health care  provider can offer advice or refer you to specialists for help with various needs.  Do not use hot tubs, steam rooms, or saunas.  Do not douche or use tampons or scented sanitary pads.  Do not cross your legs for long periods of time.  Avoid cat litter boxes and soil used by cats. These carry germs  that can cause birth defects in the baby and possibly loss of the fetus by miscarriage or stillbirth.  Avoid all smoking, herbs, alcohol, and unprescribed drugs. Chemicals in these products can affect the formation and growth of the baby.  Do not use any products that contain nicotine or tobacco, such as cigarettes and e-cigarettes. If you need help quitting, ask your health care provider.  Visit your dentist if you have not gone yet during your pregnancy. Use a soft toothbrush to brush your teeth and be gentle when you floss. Contact a health care provider if:  You have dizziness.  You have mild pelvic cramps, pelvic pressure, or nagging pain in the abdominal area.  You have persistent nausea, vomiting, or diarrhea.  You have a bad smelling vaginal discharge.  You have pain when you urinate. Get help right away if:  You have a fever.  You are leaking fluid from your vagina.  You have spotting or bleeding from your vagina.  You have severe abdominal cramping or pain.  You have rapid weight gain or weight loss.  You have shortness of breath with chest pain.  You notice sudden or extreme swelling of your face, hands, ankles, feet, or legs.  You have not felt your baby move in over an hour.  You have severe headaches that do not go away when you take medicine.  You have vision changes. Summary  The second trimester is from week 14 through week 27 (months 4 through 6). It is also a time when the fetus is growing rapidly.  Your body goes through many changes during pregnancy. The changes vary from woman to woman.  Avoid all smoking, herbs, alcohol, and unprescribed drugs.  These chemicals affect the formation and growth your baby.  Do not use any tobacco products, such as cigarettes, chewing tobacco, and e-cigarettes. If you need help quitting, ask your health care provider.  Contact your health care provider if you have any questions. Keep all prenatal visits as told by your health care provider. This is important. This information is not intended to replace advice given to you by your health care provider. Make sure you discuss any questions you have with your health care provider. Document Released: 10/20/2001 Document Revised: 12/01/2016 Document Reviewed: 12/01/2016 Elsevier Interactive Patient Education  2018 [employer].  Trial of Labor After Cesarean Delivery A trial of labor after cesarean delivery (TOLAC) is when a woman tries to give birth vaginally after a previous cesarean delivery. TOLAC may be a safe and appropriate option for you depending on your medical history and other risk factors. When TOLAC is successful and you are able to have a vaginal delivery, this is called a vaginal birth after cesarean delivery (VBAC). Candidates for TOLAC TOLAC is possible for some women who:  Have undergone one or two prior cesarean deliveries in which the incision of the uterus was horizontal (low transverse).  Are carrying twins and have had one prior low transverse incision during a cesarean delivery.  Do not have a vertical (classical) uterine scar.  Have not had a tear in the wall of their uterus (uterine rupture).  TOLAC is also supported for women who meet appropriate criteria and:  Are under the age of 40 years.  Are tall and have a body mass index (BMI) of less than 30.  Have an unknown uterine scar.  Give birth in a facility equipped to handle an emergency cesarean delivery. This team should be able to handle possible complications such  as a uterine rupture.  Have thorough counseling about the benefits and risks of TOLAC.  Have discussed  future pregnancy plans with their health care provider.  Plan to have several more pregnancies.  Most successful candidates for TOLAC:  Have had a successful vaginal delivery before or after their cesarean delivery.  Experience labor that begins naturally on or before the due date (40 weeks of gestation).  Do not have a very large (macrosomic) baby.  Had a prior cesarean delivery but are not currently experiencing factors that would prompt a cesarean delivery (such as a breech position).  Had only one prior cesarean delivery.  Had a prior cesarean delivery that was performed early in labor and not after full cervical dilation. TOLAC may be most appropriate for women who meet the above guidelines and who plan to have more pregnancies. TOLAC is not recommended for home births. Least successful candidates for TOLAC:  Have an induced labor with an unfavorable cervix. An unfavorable cervix is when the cervix is not dilating enough (among other factors).  Have never had a vaginal delivery.  Have had more than two cesarean deliveries.  Have a pregnancy at more than 40 weeks of gestation.  Are pregnant with a baby with a suspected weight greater than 4,000 grams (8 pounds) and who have no prior history of a vaginal delivery.  Have closely spaced pregnancies. Suggested benefits of TOLAC  You may have a faster recovery time.  You may have a shorter stay in the hospital.  You may have less pain and fewer problems than with a cesarean delivery. Women who have a cesarean delivery have a higher chance of needing blood or getting a fever, an infection, or a blood clot in the legs. Suggested risks of TOLAC The highest risk of complications happens to women who attempt a TOLAC and fail. A failed TOLAC results in an unplanned cesarean delivery. Risks related to [facility] or repeat cesarean deliveries include:  Blood loss.  Infection.  Blood clot.  Injury to surrounding tissues or  organs.  Having to remove the uterus (hysterectomy).  Potential problems with the placenta (such as placenta previa or placenta accreta) in future pregnancies.  Although very rare, the main concerns with TOLAC are:  Rupture of the uterine scar from a past cesarean delivery.  Needing an emergency cesarean delivery.  Having a bad outcome for the baby (perinatal morbidity).  Where to find more information:  American Congress of Obstetricians and Gynecologists: www.acog.org  [employer] Nurse-Midwives: www.midwife.org This information is not intended to replace advice given to you by your health care provider. Make sure you discuss any questions you have with your health care provider. Document Released: 07/14/2011 Document Revised: 09/23/2016 Document Reviewed: 04/17/2013 Elsevier Interactive Patient Education  2018 [employer].   Preterm Labor and Birth Information The normal length of a pregnancy is 39-41 weeks. Preterm labor is when labor starts before 37 completed weeks of pregnancy. What are the risk factors for preterm labor? Preterm labor is more likely to occur in women who:  Have certain infections during pregnancy such as a bladder infection, sexually transmitted infection, or infection inside the uterus (chorioamnionitis).  Have a shorter-than-normal cervix.  Have gone into preterm labor before.  Have had surgery on their cervix.  Are younger than age 4 or older than age 22.  Are African American.  Are pregnant with twins or multiple babies (multiple gestation).  Take street drugs or smoke while pregnant.  Do not gain enough  weight while pregnant.  Became pregnant shortly after having been pregnant.  What are the symptoms of preterm labor? Symptoms of preterm labor include:  Cramps similar to those that can happen during a menstrual period. The cramps may happen with diarrhea.  Pain in the abdomen or lower back.  Regular uterine contractions  that may feel like tightening of the abdomen.  A feeling of increased pressure in the pelvis.  Increased watery or bloody mucus discharge from the vagina.  Water breaking (ruptured amniotic sac).  Why is it important to recognize signs of preterm labor? It is important to recognize signs of preterm labor because babies who are born prematurely may not be fully developed. This can put them at an increased risk for:  Long-term (chronic) heart and lung problems.  Difficulty immediately after birth with regulating body systems, including blood sugar, body temperature, heart rate, and breathing rate.  Bleeding in the brain.  Cerebral palsy.  Learning difficulties.  Death.  These risks are highest for babies who are born before 34 weeks of pregnancy. How is preterm labor treated? Treatment depends on the length of your pregnancy, your condition, and the health of your baby. It may involve:  Having a stitch (suture) placed in your cervix to prevent your cervix from opening too early (cerclage).  Taking or being given medicines, such as: ? Hormone medicines. These may be given early in pregnancy to help support the pregnancy. ? Medicine to stop contractions. ? Medicines to help mature the baby's lungs. These may be prescribed if the risk of delivery is high. ? Medicines to prevent your baby from developing cerebral palsy.  If the labor happens before 34 weeks of pregnancy, you may need to stay in the hospital. What should I do if I think I am in preterm labor? If you think that you are going into preterm labor, call your health care provider right away. How can I prevent preterm labor in future pregnancies? To increase your chance of having a full-term pregnancy:  Do not use any tobacco products, such as cigarettes, chewing tobacco, and e-cigarettes. If you need help quitting, ask your health care provider.  Do not use street drugs or medicines that have not been prescribed to you  during your pregnancy.  Talk with your health care provider before taking any herbal supplements, even if you have been taking them regularly.  Make sure you gain a healthy amount of weight during your pregnancy.  Watch for infection. If you think that you might have an infection, get it checked right away.  Make sure to tell your health care provider if you have gone into preterm labor before.  This information is not intended to replace advice given to you by your health care provider. Make sure you discuss any questions you have with your health care provider. Document Released: 01/16/2004 Document Revised: 04/07/2016 Document Reviewed: 03/18/2016 Elsevier Interactive Patient Education  2018 [employer].

## 2018-08-14 LAB — CULTURE, OB URINE

## 2018-08-14 LAB — URINE CULTURE, OB REFLEX: Organism ID, Bacteria: NO GROWTH

## 2018-08-15 ENCOUNTER — Encounter: Payer: Self-pay | Admitting: General Practice

## 2018-08-16 LAB — CYTOLOGY - PAP
ADEQUACY: ABSENT
BACTERIAL VAGINITIS: NEGATIVE
CANDIDA VAGINITIS: POSITIVE — AB
CHLAMYDIA, DNA PROBE: NEGATIVE
Diagnosis: NEGATIVE
HPV (WINDOPATH): NOT DETECTED
NEISSERIA GONORRHEA: NEGATIVE
TRICH (WINDOWPATH): NEGATIVE

## 2018-08-18 ENCOUNTER — Telehealth: Payer: Self-pay | Admitting: *Deleted

## 2018-08-18 DIAGNOSIS — O99019 Anemia complicating pregnancy, unspecified trimester: Secondary | ICD-10-CM | POA: Insufficient documentation

## 2018-08-18 MED ORDER — FERROUS SULFATE 325 (65 FE) MG PO TABS: 325.0000 mg | ORAL_TABLET | Freq: Two times a day (BID) | ORAL | 1 refills | Status: DC

## 2018-08-18 NOTE — Telephone Encounter (Signed)
-----   Message from Marlis Edelson, CNM sent at 08/18/2018 10:49 AM EDT ----- Pt will be notified by RN, Dorisann Frames regarding low hgb and need for iron.  RX sent to pharmacy.

## 2018-08-18 NOTE — Addendum Note (Signed)
Addended by: Marlis Edelson on: 08/18/2018 10:49 AM   Modules accepted: Orders

## 2018-08-18 NOTE — Telephone Encounter (Signed)
Left voice message for patient to call nurse back regarding low iron. Iron tablets were sent to patient's pharmacy.  Clovis Pu, RN

## 2018-08-19 NOTE — Telephone Encounter (Signed)
Left voice message for patient to call clinic regarding test results and to check with her pharmacy regarding medications.  Clovis Pu, RN

## 2018-08-22 LAB — OBSTETRIC PANEL, INCLUDING HIV
ANTIBODY SCREEN: NEGATIVE
BASOS ABS: 0 10*3/uL (ref 0.0–0.2)
Basos: 0 %
EOS (ABSOLUTE): 0.2 10*3/uL (ref 0.0–0.4)
Eos: 3 %
HEMATOCRIT: 31.3 % — AB (ref 34.0–46.6)
HEP B S AG: NEGATIVE
HIV SCREEN 4TH GENERATION: NONREACTIVE
Hemoglobin: 9.6 g/dL — ABNORMAL LOW (ref 11.1–15.9)
IMMATURE GRANS (ABS): 0 10*3/uL (ref 0.0–0.1)
Immature Granulocytes: 0 %
LYMPHS: 24 %
Lymphocytes Absolute: 1.9 10*3/uL (ref 0.7–3.1)
MCH: 23.9 pg — ABNORMAL LOW (ref 26.6–33.0)
MCHC: 30.7 g/dL — ABNORMAL LOW (ref 31.5–35.7)
MCV: 78 fL — AB (ref 79–97)
MONOCYTES: 5 %
Monocytes Absolute: 0.4 10*3/uL (ref 0.1–0.9)
NEUTROS ABS: 5.5 10*3/uL (ref 1.4–7.0)
Neutrophils: 68 %
PLATELETS: 332 10*3/uL (ref 150–450)
RBC: 4.01 x10E6/uL (ref 3.77–5.28)
RDW: 14.6 % (ref 12.3–15.4)
RPR: NONREACTIVE
RUBELLA: 8.59 {index} (ref >0.99)
Rh Factor: POSITIVE
WBC: 8.1 10*3/uL (ref 3.4–10.8)

## 2018-08-22 LAB — HEMOGLOBINOPATHY EVALUATION
HGB C: 0 %
HGB S: 0 %
HGB VARIANT: 0 %
Hemoglobin A2 Quantitation: 2.3 % (ref 1.8–3.2)
Hemoglobin F Quantitation: 0 % (ref 0.0–2.0)
Hgb A: 97.7 % (ref 96.4–98.8)

## 2018-08-22 LAB — SMN1 COPY NUMBER ANALYSIS (SMA CARRIER SCREENING)

## 2018-08-22 LAB — CYSTIC FIBROSIS MUTATION 97: Interpretation: NOT DETECTED

## 2018-08-22 NOTE — Telephone Encounter (Signed)
Patient called and advised of low iron and to take iron tablets BID and positive for yeast on PAP. Pt stated understanding.  Clovis Pu, RN

## 2018-08-26 ENCOUNTER — Encounter: Payer: Self-pay | Admitting: Family

## 2018-08-26 ENCOUNTER — Ambulatory Visit (INDEPENDENT_AMBULATORY_CARE_PROVIDER_SITE_OTHER): Payer: Medicaid Other | Admitting: Family

## 2018-08-26 ENCOUNTER — Encounter (HOSPITAL_COMMUNITY): Payer: Self-pay

## 2018-08-26 DIAGNOSIS — O09299 Supervision of pregnancy with other poor reproductive or obstetric history, unspecified trimester: Secondary | ICD-10-CM

## 2018-08-26 NOTE — Progress Notes (Signed)
   PRENATAL VISIT NOTE  Subjective:  Carrie Alvarado is a 27 y.o. G3P1101 at [redacted]w[redacted]d being seen today for ongoing prenatal care.  Here with FOB Jeannine Kitten.  She is currently monitored for the following issues for this high-risk pregnancy and has History of cesarean delivery affecting pregnancy; Supervision of high risk pregnancy, antepartum; History of preterm premature rupture of membranes (PROM) in previous pregnancy, currently pregnant, unspecified trimester; and Anemia affecting pregnancy on their problem list.  Patient reports fatigue.  Denies lightheadedness, chest pain, or palpitations.  Contractions: Not present. Vag. Bleeding: None.  Movement: Present. Denies leaking of fluid.   The following portions of the patient's history were reviewed and updated as appropriate: allergies, current medications, past family history, past medical history, past social history, past surgical history and problem list. Problem list updated.  Objective:   Vitals:   08/26/18 0927  BP: 106/70  Pulse: 87  Weight: 129 lb 3.2 oz (58.6 kg)    Fetal Status: Fetal Heart Rate (bpm): 149 Fundal Height: 19 cm Movement: Present     General:  Alert, oriented and cooperative. Patient is in no acute distress.  Skin: Skin is warm and dry. No rash noted.   Cardiovascular: Normal heart rate noted  Respiratory: Normal respiratory effort, no problems with respiration noted  Abdomen: Soft, gravid, appropriate for gestational age.  Pain/Pressure: Absent     Pelvic: Cervical exam deferred        Extremities: Normal range of motion.  Edema: None  Mental Status: Normal mood and affect. Normal behavior. Normal judgment and thought content.   Assessment and Plan:  Pregnancy: G3P1101 at [redacted]w[redacted]d  1.  Supervision of High-Risk Pregnancy - Reviewed ultrasound and MFM appt for next week and what to expect - Reviewed labs (CBC)  2.  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  3.  Anemia Affecting Pregnancy - Continue taking ferrous sulfate  4.  History of C-Section - Plan to discuss with attendings the possibility of TOLAC after two csections   Preterm labor symptoms and general obstetric precautions including but not limited to vaginal bleeding, contractions, leaking of fluid and fetal movement were reviewed in detail with the patient. Please refer to After Visit Summary for other counseling recommendations.  Return in about 4 weeks (around 09/23/2018).  May return sooner for 17-p if records confirm PPROM.  Future Appointments  Date Time Provider Department Center  09/02/2018  9:00 AM [facility] [place] 3 [facility]  09/02/2018 10:00 AM [facility] GENETIC COUNSELING RM [facility]    Rochele Pages, CNM

## 2018-08-26 NOTE — Patient Instructions (Signed)

## 2018-08-30 ENCOUNTER — Encounter (HOSPITAL_COMMUNITY): Payer: Self-pay

## 2018-09-02 ENCOUNTER — Other Ambulatory Visit (HOSPITAL_COMMUNITY): Payer: Self-pay

## 2018-09-02 ENCOUNTER — Ambulatory Visit (HOSPITAL_COMMUNITY)
Admission: RE | Admit: 2018-09-02 | Discharge: 2018-09-02 | Disposition: A | Discharge status: Home / Self Care | Payer: Medicaid Other | Source: Ambulatory Visit | Attending: Family | Admitting: Family

## 2018-09-02 ENCOUNTER — Ambulatory Visit (HOSPITAL_COMMUNITY): Payer: Self-pay

## 2018-09-02 ENCOUNTER — Encounter (HOSPITAL_COMMUNITY): Payer: Self-pay

## 2018-09-02 DIAGNOSIS — Z3A19 19 weeks gestation of pregnancy: Secondary | ICD-10-CM | POA: Diagnosis not present

## 2018-09-02 DIAGNOSIS — Z363 Encounter for antenatal screening for malformations: Secondary | ICD-10-CM | POA: Diagnosis not present

## 2018-09-02 DIAGNOSIS — O34212 Maternal care for vertical scar from previous cesarean delivery: Secondary | ICD-10-CM | POA: Diagnosis not present

## 2018-09-02 DIAGNOSIS — O34219 Maternal care for unspecified type scar from previous cesarean delivery: Secondary | ICD-10-CM | POA: Diagnosis not present

## 2018-09-02 DIAGNOSIS — O09291 Supervision of pregnancy with other poor reproductive or obstetric history, first trimester: Secondary | ICD-10-CM

## 2018-09-02 DIAGNOSIS — O09292 Supervision of pregnancy with other poor reproductive or obstetric history, second trimester: Secondary | ICD-10-CM | POA: Diagnosis not present

## 2018-09-05 ENCOUNTER — Other Ambulatory Visit (HOSPITAL_COMMUNITY): Payer: Self-pay | Admitting: *Deleted

## 2018-09-05 DIAGNOSIS — O09293 Supervision of pregnancy with other poor reproductive or obstetric history, third trimester: Secondary | ICD-10-CM

## 2018-09-09 ENCOUNTER — Telehealth (HOSPITAL_COMMUNITY): Payer: Self-pay | Admitting: *Deleted

## 2018-09-15 ENCOUNTER — Encounter: Payer: Self-pay | Admitting: General Practice

## 2018-09-23 ENCOUNTER — Ambulatory Visit (INDEPENDENT_AMBULATORY_CARE_PROVIDER_SITE_OTHER): Payer: Medicaid Other | Admitting: Family

## 2018-09-23 VITALS — BP 110/73 | HR 79 | Wt 135.4 lb

## 2018-09-23 DIAGNOSIS — O0990 Supervision of high risk pregnancy, unspecified, unspecified trimester: Secondary | ICD-10-CM

## 2018-09-23 DIAGNOSIS — O99012 Anemia complicating pregnancy, second trimester: Secondary | ICD-10-CM

## 2018-09-23 DIAGNOSIS — O219 Vomiting of pregnancy, unspecified: Secondary | ICD-10-CM

## 2018-09-23 DIAGNOSIS — O34219 Maternal care for unspecified type scar from previous cesarean delivery: Secondary | ICD-10-CM

## 2018-09-23 DIAGNOSIS — O0992 Supervision of high risk pregnancy, unspecified, second trimester: Secondary | ICD-10-CM

## 2018-09-23 MED ORDER — FERROUS SULFATE 325 (65 FE) MG PO TABS: 325.0000 mg | ORAL_TABLET | Freq: Two times a day (BID) | ORAL | 1 refills | Status: DC

## 2018-09-23 MED ORDER — ONDANSETRON 4 MG PO TBDP: 4.0000 mg | ORAL_TABLET | Freq: Three times a day (TID) | ORAL | 0 refills | Status: DC | PRN

## 2018-09-23 NOTE — Patient Instructions (Signed)

## 2018-09-23 NOTE — Progress Notes (Signed)
   PRENATAL VISIT NOTE  Subjective:  Carrie Alvarado is a 27 y.o. G3P1101 at 9329w1d being seen today for ongoing prenatal care.  She is currently monitored for the following issues for this high-risk pregnancy and has History of cesarean delivery affecting pregnancy; Supervision of high risk pregnancy, antepartum; History of preterm premature rupture of membranes (PROM) in previous pregnancy, currently pregnant, unspecified trimester; and Anemia affecting pregnancy on their problem list.  Patient reports fatigue.  Contractions: Not present. Vag. Bleeding: None.  Movement: Present. Denies leaking of fluid.   The following portions of the patient's history were reviewed and updated as appropriate: allergies, current medications, past family history, past medical history, past social history, past surgical history and problem list. Problem list updated.  Objective:   Vitals:   09/23/18 1015  BP: 110/73  Pulse: 79  Weight: 135 lb 6.4 oz (61.4 kg)    Fetal Status: Fetal Heart Rate (bpm): 145 Fundal Height: 23 cm Movement: Present     General:  Alert, oriented and cooperative. Patient is in no acute distress.  Skin: Skin is warm and dry. No rash noted.   Cardiovascular: Normal heart rate noted  Respiratory: Normal respiratory effort, no problems with respiration noted  Abdomen: Soft, gravid, appropriate for gestational age.  Pain/Pressure: Absent     Pelvic: Cervical exam deferred        Extremities: Normal range of motion.  Edema: None  Mental Status: Normal mood and affect. Normal behavior. Normal judgment and thought content.   Assessment and Plan:  Pregnancy: G3P1101 at 7429w1d  1. Anemia affecting pregnancy in second trimester - ferrous sulfate (FERROUSUL) 325 (65 FE) MG tablet; Take 1 tablet (325 mg total) by mouth 2 (two) times daily.  Dispense: 60 tablet; Refill: 1  2. Nausea and vomiting of pregnancy, antepartum - ondansetron (ZOFRAN ODT) 4 MG disintegrating tablet; Take 1 tablet  (4 mg total) by mouth every 8 (eight) hours as needed for nausea or vomiting.  Dispense: 20 tablet; Refill: 0  3. History of cesarean delivery affecting pregnancy - Pending discussion with attendings regarding being a candidate after two csections  4. Supervision of high risk pregnancy, antepartum - Reviewed genetic screening (nml) and results of ultrasound - Reviewed fetal model of 22 wk fetus  Preterm labor symptoms and general obstetric precautions including but not limited to vaginal bleeding, contractions, leaking of fluid and fetal movement were reviewed in detail with the patient. Please refer to After Visit Summary for other counseling recommendations.  Return in 4 weeks (on 10/21/2018).  Future Appointments  Date Time Provider Department Center  10/21/2018 10:10 AM Amedeo GoryKarim-Rhoades, Emran Molzahn N, CNM [facility] None  11/16/2018  8:45 AM [facility] US 5 [facility]    Amedeo GoryWalidah N Karim-Rhoades, CNM

## 2018-09-28 ENCOUNTER — Ambulatory Visit (HOSPITAL_COMMUNITY): Payer: Self-pay

## 2018-10-21 ENCOUNTER — Ambulatory Visit (INDEPENDENT_AMBULATORY_CARE_PROVIDER_SITE_OTHER): Payer: Medicaid Other | Admitting: Family

## 2018-10-21 VITALS — BP 98/57 | HR 78 | Wt 134.6 lb

## 2018-10-21 DIAGNOSIS — O34219 Maternal care for unspecified type scar from previous cesarean delivery: Secondary | ICD-10-CM

## 2018-10-21 DIAGNOSIS — O99012 Anemia complicating pregnancy, second trimester: Secondary | ICD-10-CM

## 2018-10-21 DIAGNOSIS — O0990 Supervision of high risk pregnancy, unspecified, unspecified trimester: Secondary | ICD-10-CM

## 2018-10-21 DIAGNOSIS — O0992 Supervision of high risk pregnancy, unspecified, second trimester: Secondary | ICD-10-CM

## 2018-10-21 NOTE — Progress Notes (Signed)
   PRENATAL VISIT NOTE  Subjective:  Carrie Alvarado is a 27 y.o. G3P1101 at 2944w1d being seen today for ongoing prenatal care.  She is currently monitored for the following issues for this high-risk pregnancy and has History of cesarean delivery affecting pregnancy; Supervision of high risk pregnancy, antepartum; History of preterm premature rupture of membranes (PROM) in previous pregnancy, currently pregnant, unspecified trimester; and Anemia affecting pregnancy on their problem list.  Patient reports no complaints and "feels a bit bigger".  Contractions: Not present. Vag. Bleeding: None.  Movement: Present. Denies leaking of fluid.   The following portions of the patient's history were reviewed and updated as appropriate: allergies, current medications, past family history, past medical history, past social history, past surgical history and problem list. Problem list updated.  Objective:   Vitals:   10/21/18 1024  BP: (!) 98/57  Pulse: 78  Weight: 134 lb 9.6 oz (61.1 kg)    Fetal Status: Fetal Heart Rate (bpm): 140 Fundal Height: 27 cm Movement: Present     General:  Alert, oriented and cooperative. Patient is in no acute distress.  Skin: Skin is warm and dry. No rash noted.   Cardiovascular: Normal heart rate noted  Respiratory: Normal respiratory effort, no problems with respiration noted  Abdomen: Soft, gravid, appropriate for gestational age.  Pain/Pressure: Absent     Pelvic: Cervical exam deferred        Extremities: Normal range of motion.  Edema: None  Mental Status: Normal mood and affect. Normal behavior. Normal judgment and thought content.   Assessment and Plan:  Pregnancy: G3P1101 at 3544w1d  1. Supervision of high risk pregnancy, antepartum - Pt will return for 3rd trimester labs - Reviewed warning signs  2. History of cesarean delivery affecting pregnancy - Discuss with MD's at MD only meeting > desires TOLAC  3. Anemia affecting pregnancy in second trimester -  Continue ferrous sulfate  Preterm labor symptoms and general obstetric precautions including but not limited to vaginal bleeding, contractions, leaking of fluid and fetal movement were reviewed in detail with the patient. Please refer to After Visit Summary for other counseling recommendations.  Return in about 3 weeks (around 11/11/2018).  Future Appointments  Date Time Provider Department Center  11/16/2018  8:45 AM [facility] US 5 [facility]US    Amedeo GoryWalidah N Karim-Rhoades, CNM

## 2018-10-26 ENCOUNTER — Encounter: Payer: Self-pay | Admitting: General Practice

## 2018-10-26 ENCOUNTER — Other Ambulatory Visit (INDEPENDENT_AMBULATORY_CARE_PROVIDER_SITE_OTHER): Payer: Medicaid Other

## 2018-10-26 DIAGNOSIS — Z23 Encounter for immunization: Secondary | ICD-10-CM

## 2018-10-26 DIAGNOSIS — O0990 Supervision of high risk pregnancy, unspecified, unspecified trimester: Secondary | ICD-10-CM

## 2018-10-26 NOTE — Progress Notes (Signed)
Patient is here for lab work only. Patient did receive her Tdap vaccination as well.

## 2018-10-26 NOTE — Patient Instructions (Signed)
Tdap Vaccine (Tetanus, Diphtheria and Pertussis): What You Need to Know  1. Why get vaccinated?  Tetanus, diphtheria and pertussis are very serious diseases. Tdap vaccine can protect us from these diseases. And, Tdap vaccine given to pregnant women can protect newborn babies against pertussis..  TETANUS (Lockjaw) is rare in the [place] today. It causes painful muscle tightening and stiffness, usually all over the body.  · It can lead to tightening of muscles in the head and neck so you can't open your mouth, swallow, or sometimes even breathe. Tetanus kills about 1 out of 10 people who are infected even after receiving the best medical care.  DIPHTHERIA is also rare in the [place] today. It can cause a thick coating to form in the back of the throat.  · It can lead to breathing problems, heart failure, paralysis, and death.  PERTUSSIS (Whooping Cough) causes severe coughing spells, which can cause difficulty breathing, vomiting and disturbed sleep.  · It can also lead to weight loss, incontinence, and rib fractures. Up to 2 in 100 adolescents and 5 in 100 adults with pertussis are hospitalized or have complications, which could include pneumonia or death.  These diseases are caused by bacteria. Diphtheria and pertussis are spread from person to person through secretions from coughing or sneezing. Tetanus enters the body through cuts, scratches, or wounds.  Before vaccines, as many as 200,000 cases of diphtheria, 200,000 cases of pertussis, and hundreds of cases of tetanus, were reported in the [place] each year. Since vaccination began, reports of cases for tetanus and diphtheria have dropped by about 99% and for pertussis by about 80%.  2. Tdap vaccine  Tdap vaccine can protect adolescents and adults from tetanus, diphtheria, and pertussis. One dose of Tdap is routinely given at age 11 or 12. People who did not get Tdap at that age should get it as soon as possible.  Tdap is especially important  for healthcare professionals and anyone having close contact with a baby younger than 12 months.  Pregnant women should get a dose of Tdap during every pregnancy, to protect the newborn from pertussis. Infants are most at risk for severe, life-threatening complications from pertussis.  Another vaccine, called Td, protects against tetanus and diphtheria, but not pertussis. A Td booster should be given every 10 years. Tdap may be given as one of these boosters if you have never gotten Tdap before. Tdap may also be given after a severe cut or burn to prevent tetanus infection.  Your doctor or the person giving you the vaccine can give you more information.  Tdap may safely be given at the same time as other vaccines.  3. Some people should not get this vaccine  · A person who has ever had a life-threatening allergic reaction after a previous dose of any diphtheria, tetanus or pertussis containing vaccine, OR has a severe allergy to any part of this vaccine, should not get Tdap vaccine. Tell the person giving the vaccine about any severe allergies.  · Anyone who had coma or long repeated seizures within 7 days after a childhood dose of DTP or DTaP, or a previous dose of Tdap, should not get Tdap, unless a cause other than the vaccine was found. They can still get Td.  · Talk to your doctor if you:  ? have seizures or another nervous system problem,  ? had severe pain or swelling after any vaccine containing diphtheria, tetanus or pertussis,  ? ever had a condition   called Guillain-Barré Syndrome (GBS),  ? aren't feeling well on the day the shot is scheduled.  4. Risks  With any medicine, including vaccines, there is a chance of side effects. These are usually mild and go away on their own. Serious reactions are also possible but are rare.  Most people who get Tdap vaccine do not have any problems with it.  Mild problems following Tdap  (Did not interfere with activities)  · Pain where the shot was given (about 3 in 4  adolescents or 2 in 3 adults)  · Redness or swelling where the shot was given (about 1 person in 5)  · Mild fever of at least 100.4°F (up to about 1 in 25 adolescents or 1 in 100 adults)  · Headache (about 3 or 4 people in 10)  · Tiredness (about 1 person in 3 or 4)  · Nausea, vomiting, diarrhea, stomach ache (up to 1 in 4 adolescents or 1 in 10 adults)  · Chills, sore joints (about 1 person in 10)  · Body aches (about 1 person in 3 or 4)  · Rash, swollen glands (uncommon)  Moderate problems following Tdap  (Interfered with activities, but did not require medical attention)  · Pain where the shot was given (up to 1 in 5 or 6)  · Redness or swelling where the shot was given (up to about 1 in 16 adolescents or 1 in 12 adults)  · Fever over 102°F (about 1 in 100 adolescents or 1 in 250 adults)  · Headache (about 1 in 7 adolescents or 1 in 10 adults)  · Nausea, vomiting, diarrhea, stomach ache (up to 1 or 3 people in 100)  · Swelling of the entire arm where the shot was given (up to about 1 in 500).  Severe problems following Tdap  (Unable to perform usual activities; required medical attention)  · Swelling, severe pain, bleeding and redness in the arm where the shot was given (rare).  Problems that could happen after any vaccine:  · People sometimes faint after a medical procedure, including vaccination. Sitting or lying down for about 15 minutes can help prevent fainting, and injuries caused by a fall. Tell your doctor if you feel dizzy, or have vision changes or ringing in the ears.  · Some people get severe pain in the shoulder and have difficulty moving the arm where a shot was given. This happens very rarely.  · Any medication can cause a severe allergic reaction. Such reactions from a vaccine are very rare, estimated at fewer than 1 in a million doses, and would happen within a few minutes to a few hours after the vaccination.  As with any medicine, there is a very remote chance of a vaccine causing a serious  injury or death.  The safety of vaccines is always being monitored. For more information, visit: www.cdc.gov/vaccinesafety/  5. What if there is a serious problem?  What should I look for?  · Look for anything that concerns you, such as signs of a severe allergic reaction, very high fever, or unusual behavior.  Signs of a severe allergic reaction can include hives, swelling of the face and throat, difficulty breathing, a fast heartbeat, dizziness, and weakness. These would usually start a few minutes to a few hours after the vaccination.  What should I do?  · If you think it is a severe allergic reaction or other emergency that can't wait, call 9-1-1 or get the person to the nearest hospital. Otherwise,   call your doctor.  · Afterward, the reaction should be reported to the Vaccine Adverse Event Reporting System (VAERS). Your doctor might file this report, or you can do it yourself through the VAERS web site at www.vaers.hhs.gov, or by calling 1-800-822-7967.  VAERS does not give medical advice.  6. The National Vaccine Injury Compensation Program  The National Vaccine Injury Compensation Program (VICP) is a federal program that was created to compensate people who may have been injured by certain vaccines.  Persons who believe they may have been injured by a vaccine can learn about the program and about filing a claim by calling 1-800-338-2382 or visiting the VICP website at www.hrsa.gov/vaccinecompensation. There is a time limit to file a claim for compensation.  7. How can I learn more?  · Ask your doctor. He or she can give you the vaccine package insert or suggest other sources of information.  · Call your local or state health department.  · Contact the Centers for Disease Control and Prevention (CDC):  ? Call 1-800-232-4636 (1-800-CDC-INFO) or  ? Visit CDC's website at www.cdc.gov/vaccines  Vaccine Information Statement Tdap Vaccine (01/02/2014)  This information is not intended to replace advice given to you  by your health care provider. Make sure you discuss any questions you have with your health care provider.  Document Released: 04/26/2012 Document Revised: 06/13/2018 Document Reviewed: 06/13/2018  Elsevier Interactive Patient Education © 2019 [employer]

## 2018-10-27 LAB — CBC
HEMATOCRIT: 35.8 % (ref 34.0–46.6)
HEMOGLOBIN: 11.5 g/dL (ref 11.1–15.9)
MCH: 26.1 pg — ABNORMAL LOW (ref 26.6–33.0)
MCHC: 32.1 g/dL (ref 31.5–35.7)
MCV: 81 fL (ref 79–97)
Platelets: 272 10*3/uL (ref 150–450)
RBC: 4.4 x10E6/uL (ref 3.77–5.28)
RDW: 16.6 % — ABNORMAL HIGH (ref 12.3–15.4)
WBC: 8.5 10*3/uL (ref 3.4–10.8)

## 2018-10-27 LAB — GLUCOSE TOLERANCE, 2 HOURS W/ 1HR
GLUCOSE, FASTING: 76 mg/dL (ref 65–91)
Glucose, 1 hour: 168 mg/dL (ref 65–179)
Glucose, 2 hour: 127 mg/dL (ref 65–152)

## 2018-10-27 LAB — HIV ANTIBODY (ROUTINE TESTING W REFLEX): HIV SCREEN 4TH GENERATION: NONREACTIVE

## 2018-10-27 LAB — RPR: RPR Ser Ql: NONREACTIVE

## 2018-11-16 ENCOUNTER — Ambulatory Visit (HOSPITAL_COMMUNITY)
Admission: RE | Admit: 2018-11-16 | Discharge: 2018-11-16 | Disposition: A | Discharge status: Home / Self Care | Payer: Medicaid Other | Source: Ambulatory Visit | Attending: Family | Admitting: Family

## 2018-11-16 ENCOUNTER — Other Ambulatory Visit (HOSPITAL_COMMUNITY): Payer: Self-pay | Admitting: *Deleted

## 2018-11-16 ENCOUNTER — Encounter (HOSPITAL_COMMUNITY): Payer: Self-pay

## 2018-11-16 DIAGNOSIS — O34219 Maternal care for unspecified type scar from previous cesarean delivery: Secondary | ICD-10-CM | POA: Diagnosis not present

## 2018-11-16 DIAGNOSIS — O09293 Supervision of pregnancy with other poor reproductive or obstetric history, third trimester: Secondary | ICD-10-CM | POA: Diagnosis present

## 2018-11-16 DIAGNOSIS — Z8759 Personal history of other complications of pregnancy, childbirth and the puerperium: Secondary | ICD-10-CM

## 2018-11-16 DIAGNOSIS — Z363 Encounter for antenatal screening for malformations: Secondary | ICD-10-CM | POA: Diagnosis not present

## 2018-11-16 DIAGNOSIS — Z3A29 29 weeks gestation of pregnancy: Secondary | ICD-10-CM

## 2018-11-18 ENCOUNTER — Encounter: Payer: Self-pay | Admitting: Obstetrics and Gynecology

## 2018-11-18 ENCOUNTER — Ambulatory Visit (INDEPENDENT_AMBULATORY_CARE_PROVIDER_SITE_OTHER): Payer: Medicaid Other | Admitting: Obstetrics and Gynecology

## 2018-11-18 ENCOUNTER — Other Ambulatory Visit: Payer: Self-pay | Admitting: Obstetrics and Gynecology

## 2018-11-18 ENCOUNTER — Encounter: Payer: Self-pay | Admitting: General Practice

## 2018-11-18 VITALS — BP 93/62 | HR 85 | Wt 142.0 lb

## 2018-11-18 DIAGNOSIS — O34219 Maternal care for unspecified type scar from previous cesarean delivery: Secondary | ICD-10-CM

## 2018-11-18 DIAGNOSIS — Z3A30 30 weeks gestation of pregnancy: Secondary | ICD-10-CM

## 2018-11-18 DIAGNOSIS — O0990 Supervision of high risk pregnancy, unspecified, unspecified trimester: Secondary | ICD-10-CM

## 2018-11-18 DIAGNOSIS — O0993 Supervision of high risk pregnancy, unspecified, third trimester: Secondary | ICD-10-CM

## 2018-11-18 DIAGNOSIS — O99013 Anemia complicating pregnancy, third trimester: Secondary | ICD-10-CM

## 2018-11-18 NOTE — Progress Notes (Signed)
   PRENATAL VISIT NOTE  Subjective:  Carrie Alvarado is a 28 y.o. G3P1101 at [redacted]w[redacted]d being seen today for ongoing prenatal care.  She is currently monitored for the following issues for this high-risk pregnancy and has History of cesarean delivery affecting pregnancy; Supervision of high risk pregnancy, antepartum; History of preterm premature rupture of membranes (PROM) in previous pregnancy, currently pregnant, unspecified trimester; and Anemia affecting pregnancy on their problem list.  Patient reports no complaints.  Contractions: Not present. Vag. Bleeding: None.  Movement: Present. Denies leaking of fluid.   The following portions of the patient's history were reviewed and updated as appropriate: allergies, current medications, past family history, past medical history, past social history, past surgical history and problem list. Problem list updated.  Objective:   Vitals:   11/18/18 0929  BP: 93/62  Pulse: 85  Weight: 142 lb (64.4 kg)    Fetal Status: Fetal Heart Rate (bpm): 138 Fundal Height: 30 cm Movement: Present     General:  Alert, oriented and cooperative. Patient is in no acute distress.  Skin: Skin is warm and dry. No rash noted.   Cardiovascular: Normal heart rate noted  Respiratory: Normal respiratory effort, no problems with respiration noted  Abdomen: Soft, gravid, appropriate for gestational age.  Pain/Pressure: Absent     Pelvic: Cervical exam deferred        Extremities: Normal range of motion.  Edema: None  Mental Status: Normal mood and affect. Normal behavior. Normal judgment and thought content.   Assessment and Plan:  Pregnancy: G3P1101 at [redacted]w[redacted]d  1. Supervision of high risk pregnancy, antepartum Patient is doing well without complaints Patient desires BTL for contraception- consent signed today Patient scheduled with MFM to start antenatal testing at 32 weeks  2. History of cesarean delivery affecting pregnancy Patient with 2 previous cesarean section  and is interested in [facility] Discussed with patient that planned IOL will not be scheduled but if she presents with spontaneous onset of labor we will allow TOLAC In the mean time, patient will be scheduled for repeat c-section with BTL at 39 weeks  3. Anemia affecting pregnancy in third trimester Continue iron supplements  Preterm labor symptoms and general obstetric precautions including but not limited to vaginal bleeding, contractions, leaking of fluid and fetal movement were reviewed in detail with the patient. Please refer to After Visit Summary for other counseling recommendations.  Return in about 2 weeks (around 12/02/2018) for ROB.  Future Appointments  Date Time Provider Department Center  11/30/2018  9:30 AM [facility] [place] 1 [facility]US  12/07/2018  9:45 AM [facility] [place] 5 [facility]-US  12/14/2018  9:30 AM [facility] [place] 1 [facility]-M[facility] M[facility]-US    Catalina Antigua, MD

## 2018-11-18 NOTE — Patient Instructions (Signed)
Vaginal Birth After Cesarean Delivery    Vaginal birth after cesarean delivery (VBAC) is giving birth vaginally after previously delivering a baby through a cesarean section (C-section). A VBAC may be a safe option for you, depending on your health and other factors.  It is important to discuss VBAC with your health care provider early in your pregnancy so you can understand the risks, benefits, and options. Having these discussions early will give you time to make your birth plan.  Who are the best candidates for VBAC?  The best candidates for VBAC are women who:   Have had one or two prior cesarean deliveries, and the incision made during the delivery was horizontal (low transverse).   Do not have a vertical (classical) scar on their uterus.   Have not had a tear in the wall of their uterus (uterine rupture).   Plan to have more pregnancies.  A VBAC is also more likely to be successful:   In women who have previously given birth vaginally.   When labor starts by itself (spontaneously) before the due date.  What are the benefits of VBAC?  The benefits of delivering your baby vaginally instead of by a cesarean delivery include:   A shorter hospital stay.   A faster recovery time.   Less pain.   Avoiding risks associated with major surgery, such as infection and blood clots.   Less blood loss and less need for donated blood (transfusions).  What are the risks of VBAC?  The main risk of attempting a VBAC is that it may fail, forcing your health care provider to deliver your baby by a C-section.  Other risks are rare and include:   Tearing (rupture) of the scar from a past cesarean delivery.   Other risks associated with vaginal deliveries.  If a repeat cesarean delivery is needed, the risks include:   Blood loss.   Infection.   Blood clot.   Damage to surrounding organs.   Removal of the uterus (hysterectomy), if it is damaged.   Placenta problems in future pregnancies.  What else should I know  about my options?  Delivering a baby through a VBAC is similar to having a normal spontaneous vaginal delivery. Therefore, it is safe:   To try with twins.   For your health care provider to try to turn the baby from a breech position (external cephalic version) during labor.   With epidural analgesia for pain relief.  Consider where you would like to deliver your baby. VBAC should be attempted in facilities where an emergency cesarean delivery can be performed. VBAC is not recommended for home births.  Any changes in your health or your baby's health during your pregnancy may make it necessary to change your initial decision about VBAC. Your health care provider may recommend that you do not attempt a VBAC if:   Your baby's suspected weight is 8.8 lb (4 kg) or more.   You have preeclampsia. This is a condition that causes high blood pressure along with other symptoms, such as swelling and headaches.   You will have VBAC less than 19 months after your cesarean delivery.   You are past your due date.   You need to have labor started (induced) because your cervix is not ready for labor (unfavorable).  Where to find more information   American Pregnancy Association: americanpregnancy.org   American Congress of Obstetricians and Gynecologists: acog.org  Summary   Vaginal birth after cesarean delivery (VBAC) is giving birth   vaginally after previously delivering a baby through a cesarean section (C-section). A VBAC may be a safe option for you, depending on your health and other factors.   Discuss VBAC with your health care provider early in your pregnancy so you can understand the risks, benefits, options, and have plenty of time to make your birth plan.   The main risk of attempting a VBAC is that it may fail, forcing your health care provider to deliver your baby by a C-section. Other risks are rare.  This information is not intended to replace advice given to you by your health care provider. Make sure  you discuss any questions you have with your health care provider.  Document Released: 04/18/2007 Document Revised: 02/02/2017 Document Reviewed: 02/02/2017  Elsevier Interactive Patient Education  2019 Elsevier Inc.

## 2018-11-21 ENCOUNTER — Encounter (HOSPITAL_COMMUNITY): Payer: Self-pay

## 2018-11-30 ENCOUNTER — Ambulatory Visit (HOSPITAL_COMMUNITY)
Admission: RE | Admit: 2018-11-30 | Discharge: 2018-11-30 | Disposition: A | Discharge status: Home / Self Care | Payer: Medicaid Other | Source: Ambulatory Visit | Attending: Family | Admitting: Family

## 2018-11-30 ENCOUNTER — Encounter (HOSPITAL_COMMUNITY): Payer: Self-pay

## 2018-11-30 DIAGNOSIS — O34219 Maternal care for unspecified type scar from previous cesarean delivery: Secondary | ICD-10-CM | POA: Diagnosis not present

## 2018-11-30 DIAGNOSIS — Z8759 Personal history of other complications of pregnancy, childbirth and the puerperium: Secondary | ICD-10-CM | POA: Diagnosis present

## 2018-11-30 DIAGNOSIS — Z3A31 31 weeks gestation of pregnancy: Secondary | ICD-10-CM | POA: Diagnosis not present

## 2018-11-30 DIAGNOSIS — O09293 Supervision of pregnancy with other poor reproductive or obstetric history, third trimester: Secondary | ICD-10-CM | POA: Diagnosis not present

## 2018-12-02 ENCOUNTER — Encounter: Payer: Medicaid Other | Admitting: Obstetrics and Gynecology

## 2018-12-07 ENCOUNTER — Encounter (HOSPITAL_COMMUNITY): Payer: Self-pay

## 2018-12-07 ENCOUNTER — Other Ambulatory Visit (HOSPITAL_COMMUNITY): Payer: Self-pay | Admitting: *Deleted

## 2018-12-07 ENCOUNTER — Ambulatory Visit (HOSPITAL_COMMUNITY)
Admission: RE | Admit: 2018-12-07 | Discharge: 2018-12-07 | Disposition: A | Discharge status: Home / Self Care | Payer: Medicaid Other | Source: Ambulatory Visit | Attending: Family | Admitting: Family

## 2018-12-07 DIAGNOSIS — O09299 Supervision of pregnancy with other poor reproductive or obstetric history, unspecified trimester: Secondary | ICD-10-CM | POA: Diagnosis present

## 2018-12-07 DIAGNOSIS — Z8759 Personal history of other complications of pregnancy, childbirth and the puerperium: Secondary | ICD-10-CM | POA: Diagnosis present

## 2018-12-07 DIAGNOSIS — O34219 Maternal care for unspecified type scar from previous cesarean delivery: Secondary | ICD-10-CM

## 2018-12-07 DIAGNOSIS — O99013 Anemia complicating pregnancy, third trimester: Secondary | ICD-10-CM

## 2018-12-07 DIAGNOSIS — O09293 Supervision of pregnancy with other poor reproductive or obstetric history, third trimester: Secondary | ICD-10-CM

## 2018-12-07 DIAGNOSIS — Z3A32 32 weeks gestation of pregnancy: Secondary | ICD-10-CM

## 2018-12-07 DIAGNOSIS — O0990 Supervision of high risk pregnancy, unspecified, unspecified trimester: Secondary | ICD-10-CM | POA: Insufficient documentation

## 2018-12-14 ENCOUNTER — Ambulatory Visit (HOSPITAL_COMMUNITY)
Admission: RE | Admit: 2018-12-14 | Discharge: 2018-12-14 | Disposition: A | Discharge status: Home / Self Care | Payer: Medicaid Other | Source: Ambulatory Visit | Attending: Family | Admitting: Family

## 2018-12-14 ENCOUNTER — Encounter (HOSPITAL_COMMUNITY): Payer: Self-pay

## 2018-12-14 ENCOUNTER — Other Ambulatory Visit (HOSPITAL_COMMUNITY): Payer: Self-pay | Admitting: *Deleted

## 2018-12-14 DIAGNOSIS — O09293 Supervision of pregnancy with other poor reproductive or obstetric history, third trimester: Secondary | ICD-10-CM

## 2018-12-14 DIAGNOSIS — Z362 Encounter for other antenatal screening follow-up: Secondary | ICD-10-CM | POA: Diagnosis not present

## 2018-12-14 DIAGNOSIS — O34219 Maternal care for unspecified type scar from previous cesarean delivery: Secondary | ICD-10-CM | POA: Diagnosis not present

## 2018-12-14 DIAGNOSIS — Z3A33 33 weeks gestation of pregnancy: Secondary | ICD-10-CM | POA: Diagnosis not present

## 2018-12-14 DIAGNOSIS — Z8759 Personal history of other complications of pregnancy, childbirth and the puerperium: Secondary | ICD-10-CM | POA: Diagnosis not present

## 2018-12-19 ENCOUNTER — Encounter (HOSPITAL_COMMUNITY): Payer: Self-pay

## 2018-12-21 ENCOUNTER — Encounter: Payer: Self-pay | Admitting: Advanced Practice Midwife

## 2018-12-21 ENCOUNTER — Ambulatory Visit (INDEPENDENT_AMBULATORY_CARE_PROVIDER_SITE_OTHER): Payer: Medicaid Other | Admitting: Advanced Practice Midwife

## 2018-12-21 ENCOUNTER — Other Ambulatory Visit: Payer: Self-pay

## 2018-12-21 ENCOUNTER — Other Ambulatory Visit (HOSPITAL_COMMUNITY)
Admission: RE | Admit: 2018-12-21 | Discharge: 2018-12-21 | Disposition: A | Discharge status: Home / Self Care | Payer: Medicaid Other | Source: Ambulatory Visit | Attending: Advanced Practice Midwife | Admitting: Advanced Practice Midwife

## 2018-12-21 ENCOUNTER — Encounter (HOSPITAL_COMMUNITY): Payer: Self-pay

## 2018-12-21 ENCOUNTER — Ambulatory Visit (HOSPITAL_COMMUNITY)
Admission: RE | Admit: 2018-12-21 | Discharge: 2018-12-21 | Disposition: A | Discharge status: Home / Self Care | Payer: Medicaid Other | Source: Ambulatory Visit | Attending: Family | Admitting: Family

## 2018-12-21 VITALS — BP 107/71 | HR 73 | Wt 145.4 lb

## 2018-12-21 DIAGNOSIS — O0993 Supervision of high risk pregnancy, unspecified, third trimester: Secondary | ICD-10-CM

## 2018-12-21 DIAGNOSIS — Z8759 Personal history of other complications of pregnancy, childbirth and the puerperium: Secondary | ICD-10-CM | POA: Insufficient documentation

## 2018-12-21 DIAGNOSIS — N898 Other specified noninflammatory disorders of vagina: Secondary | ICD-10-CM | POA: Insufficient documentation

## 2018-12-21 DIAGNOSIS — O34219 Maternal care for unspecified type scar from previous cesarean delivery: Secondary | ICD-10-CM

## 2018-12-21 DIAGNOSIS — Z3A34 34 weeks gestation of pregnancy: Secondary | ICD-10-CM

## 2018-12-21 DIAGNOSIS — B3731 Acute candidiasis of vulva and vagina: Secondary | ICD-10-CM

## 2018-12-21 DIAGNOSIS — O09293 Supervision of pregnancy with other poor reproductive or obstetric history, third trimester: Secondary | ICD-10-CM

## 2018-12-21 DIAGNOSIS — O09213 Supervision of pregnancy with history of pre-term labor, third trimester: Secondary | ICD-10-CM

## 2018-12-21 DIAGNOSIS — B373 Candidiasis of vulva and vagina: Secondary | ICD-10-CM

## 2018-12-21 DIAGNOSIS — O09899 Supervision of other high risk pregnancies, unspecified trimester: Secondary | ICD-10-CM

## 2018-12-21 DIAGNOSIS — O0990 Supervision of high risk pregnancy, unspecified, unspecified trimester: Secondary | ICD-10-CM

## 2018-12-21 DIAGNOSIS — O09219 Supervision of pregnancy with history of pre-term labor, unspecified trimester: Secondary | ICD-10-CM

## 2018-12-21 DIAGNOSIS — L282 Other prurigo: Secondary | ICD-10-CM | POA: Insufficient documentation

## 2018-12-21 DIAGNOSIS — O99713 Diseases of the skin and subcutaneous tissue complicating pregnancy, third trimester: Secondary | ICD-10-CM

## 2018-12-21 HISTORY — DX: Personal history of other complications of pregnancy, childbirth and the puerperium: Z87.59

## 2018-12-21 MED ORDER — TERCONAZOLE 0.4 % VA CREA: 1.0000 | TOPICAL_CREAM | Freq: Every day | VAGINAL | 0 refills | Status: DC

## 2018-12-21 NOTE — Progress Notes (Signed)
   PRENATAL VISIT NOTE  Subjective:  Carrie Alvarado is a 28 y.o. G3P1101 at [redacted]w[redacted]d being seen today for ongoing prenatal care.  She is currently monitored for the following issues for this high-risk pregnancy and has History of cesarean delivery affecting pregnancy; Supervision of high risk pregnancy, antepartum; History of preterm delivery, currently pregnant; and Anemia affecting pregnancy on their problem list.  Patient reports white vaginal discharge that is both thick and thin and itching all over her body x a few weeks that hasn't improved w/ lotion. Denies rash.  Contractions: Not present. Vag. Bleeding: None.  Movement: Present. Denies leaking of fluid.   The following portions of the patient's history were reviewed and updated as appropriate: allergies, current medications, past family history, past medical history, past social history, past surgical history and problem list. Problem list updated.  Objective:   Vitals:   12/21/18 0813  BP: 107/71  Pulse: 73  Weight: 145 lb 6.4 oz (66 kg)    Fetal Status: Fetal Heart Rate (bpm): 136 Fundal Height: 34 cm Movement: Present  Presentation: Vertex  General:  Alert, oriented and cooperative. Patient is in no acute distress.  Skin: Skin is warm and dry. No rash noted.   Cardiovascular: Normal heart rate noted  Respiratory: Normal respiratory effort, no problems with respiration noted  Abdomen: Soft, gravid, appropriate for gestational age.  Pain/Pressure: Present     Pelvic: Cervical exam performed Dilation: Closed    Neg pooling. Small-mod   Extremities: Normal range of motion.  Edema: None  Mental Status: Normal mood and affect. Normal behavior. Normal judgment and thought content.   Assessment and Plan:  Pregnancy: G3P1101 at [redacted]w[redacted]d  1. Prurigo of pregnancy in third trimester  - Bile acids, total - AST - ALT - Discussed that she is being tested for ICP and that if she tests positive we will recommend delivery at 37 weeks.    2. Vaginal discharge--clearly C/W yeast infection.   - Cervicovaginal ancillary only( [place]) - Go to MAU if you leak watery fluid - terconazole (TERAZOL 7) 0.4 % vaginal cream; Place 1 applicator vaginally at bedtime.  Dispense: 45 g; Refill: 0  3. Supervision of high risk pregnancy, antepartum   4. History of cesarean delivery affecting pregnancy - Desires TOLAC if SOL before Repeat C/S scheduled at 39 weeks.   5. History of preterm delivery, currently pregnant   6. Vaginal yeast infection  - terconazole (TERAZOL 7) 0.4 % vaginal cream; Place 1 applicator vaginally at bedtime.  Dispense: 45 g; Refill: 0  7. Hx Fetal Demise - Continue antenatal testing.   Preterm labor symptoms and general obstetric precautions including but not limited to vaginal bleeding, contractions, leaking of fluid and fetal movement were reviewed in detail with the patient. Please refer to After Visit Summary for other counseling recommendations.  Return in about 1 week (around 12/28/2018).  Future Appointments  Date Time Provider Department Center  12/21/2018 10:15 AM [facility] [place] 4 [facility]  12/28/2018  9:45 AM [facility] [place] 5 [facility]  01/04/2019 10:30 AM [facility] [place] 1 [facility]  01/11/2019  9:45 AM [facility] [place] 5 [facility]    Dorathy Kinsman, CNM

## 2018-12-21 NOTE — Patient Instructions (Addendum)
You can take Benadryl capsules and use Benadryl cream or Hydrocortisone cream for itching.   Rooming-In With Your Newborn  Rooming-in is when a newborn baby stays in his or her mother's hospital room 24 hours a day instead of staying in the hospital's nursery. Rooming-in can provide many benefits. It can help new parents understand their baby's needs, establish a routine for eating and sleeping, and prepare for a smoother transition to home. How does this affect me? As a new mother, rooming-in has many benefits for you, including the following:  You will get to know your baby, and the two of you will have time to form a close bond.  You will learn when and how often your baby wants to eat.  You will learn to identify the clues that your baby gives to show that he or she is hungry and ready to feed. These may include: ? Starting to open his or her mouth. ? Making sucking motions. ? Sucking on fingers or hands.  You may have less pain after your delivery, and you may not need as much medicine.  Your breast milk supply may come in sooner.  You will be able to practice breastfeeding.  You can watch your baby's health care team interact with your baby, which can help you learn how to care for the baby. Also, when your baby's team is in your room examining your baby, you will have the opportunity to talk with them and ask questions. How does this affect my baby? Rooming-in will also provide benefits for your baby, including these:  Your baby will begin to learn what your voice sounds like, how you feel, and how you smell. This will help your baby form a close bond with you.  Your baby may eat better.  Your baby will sleep better and develop a better sleep pattern.  Your baby will cry less and be more at ease. Follow these instructions in the hospital:  Take part in your baby's checkups, baths, and screenings at the bedside as directed by your health care provider. Your partner may also  be allowed to participate in these activities. This will make it easier to learn how to care for your baby when you go home.  Ask for help with feeding if needed.  Rest when your baby sleeps or ask your partner to care for your baby while you sleep.  Talk to your health care provider if you are in pain or need medicine for pain.  Ask your health care provider to use cluster care for your baby in order to cut down on interruptions. This means that tasks such as bathing and newborn screenings are done at the same time instead of at separate times. Questions to ask your health care provider  What are my options for rooming-in with my baby?  What are your visitor rules or hours?  How can I request help if I need it?  Can my partner stay with me?  What are the different ways to feed my baby (if breastfeeding is not for you)? Tell a health care provider if your child:  Has a fever or chills.  Has few or no wet or dirty diapers.  Has trouble eating. Get help right away if your child:  Grunts or has trouble breathing.  Has a bluish skin color.  Is not moving or is moving only when touched. Summary  Rooming-in is when a newborn baby stays in his or her mother's hospital room 24 hours  a day instead of staying in the hospital's nursery.  Rooming-in can help new parents understand their baby's needs, establish a routine for eating and sleeping, and prepare for a smoother transition to home.  While rooming-in, you should rest when your baby sleeps or ask your partner to care for your baby while you sleep.  Ask your health care provider about rooming-in options, how to get help with the baby or with pain, and other ways to feed your baby while you are in the hospital. This information is not intended to replace advice given to you by your health care provider. Make sure you discuss any questions you have with your health care provider. Document Released: 04/15/2010 Document Revised:  03/22/2018 Document Reviewed: 03/22/2018 Elsevier Interactive Patient Education  [employer].

## 2018-12-22 LAB — CERVICOVAGINAL ANCILLARY ONLY
Bacterial vaginitis: NEGATIVE
Candida vaginitis: POSITIVE — AB
Chlamydia: NEGATIVE
Neisseria Gonorrhea: NEGATIVE
Trichomonas: NEGATIVE

## 2018-12-23 LAB — AST: AST: 16 IU/L (ref 0–40)

## 2018-12-23 LAB — ALT: ALT: 24 IU/L (ref 0–32)

## 2018-12-23 LAB — BILE ACIDS, TOTAL: Bile Acids Total: 9 umol/L (ref 0.0–10.0)

## 2018-12-26 ENCOUNTER — Telehealth: Payer: Self-pay | Admitting: *Deleted

## 2018-12-26 NOTE — Telephone Encounter (Signed)
Patient in formed that normal test results for Cholestasis and positive for yeast from vaginal culture.  Clovis Pu, RN

## 2018-12-26 NOTE — Telephone Encounter (Signed)
-----   Message from [place] sent at 12/25/2018  8:30 PM EST ----- Please inform pt of Normal test results for Cholestasis, but may need to test again if symptoms worsen. Wet prep showed yeast infection. She already has Rx Terazol.

## 2018-12-26 NOTE — Telephone Encounter (Signed)
Left voice message for patient to return nurse call.  Jerry Haugen L, RN  

## 2018-12-28 ENCOUNTER — Ambulatory Visit (HOSPITAL_COMMUNITY)
Admission: RE | Admit: 2018-12-28 | Discharge: 2018-12-28 | Disposition: A | Discharge status: Home / Self Care | Payer: Medicaid Other | Source: Ambulatory Visit | Attending: Family | Admitting: Family

## 2018-12-28 ENCOUNTER — Encounter (HOSPITAL_COMMUNITY): Payer: Self-pay | Admitting: *Deleted

## 2018-12-28 ENCOUNTER — Encounter (HOSPITAL_COMMUNITY): Payer: Self-pay

## 2018-12-28 ENCOUNTER — Inpatient Hospital Stay (HOSPITAL_COMMUNITY)
Admission: AD | Admit: 2018-12-28 | Discharge: 2018-12-28 | Disposition: A | Discharge status: Home / Self Care | Payer: Medicaid Other | Attending: Obstetrics and Gynecology | Admitting: Obstetrics and Gynecology

## 2018-12-28 ENCOUNTER — Other Ambulatory Visit: Payer: Self-pay

## 2018-12-28 DIAGNOSIS — O0990 Supervision of high risk pregnancy, unspecified, unspecified trimester: Secondary | ICD-10-CM

## 2018-12-28 DIAGNOSIS — Z8759 Personal history of other complications of pregnancy, childbirth and the puerperium: Secondary | ICD-10-CM

## 2018-12-28 DIAGNOSIS — O09899 Supervision of other high risk pregnancies, unspecified trimester: Secondary | ICD-10-CM

## 2018-12-28 DIAGNOSIS — O26893 Other specified pregnancy related conditions, third trimester: Secondary | ICD-10-CM | POA: Diagnosis present

## 2018-12-28 DIAGNOSIS — O09293 Supervision of pregnancy with other poor reproductive or obstetric history, third trimester: Secondary | ICD-10-CM

## 2018-12-28 DIAGNOSIS — O34219 Maternal care for unspecified type scar from previous cesarean delivery: Secondary | ICD-10-CM | POA: Diagnosis not present

## 2018-12-28 DIAGNOSIS — O99013 Anemia complicating pregnancy, third trimester: Secondary | ICD-10-CM | POA: Insufficient documentation

## 2018-12-28 DIAGNOSIS — Z3A35 35 weeks gestation of pregnancy: Secondary | ICD-10-CM

## 2018-12-28 DIAGNOSIS — Z3689 Encounter for other specified antenatal screening: Secondary | ICD-10-CM

## 2018-12-28 DIAGNOSIS — O09213 Supervision of pregnancy with history of pre-term labor, third trimester: Secondary | ICD-10-CM | POA: Diagnosis not present

## 2018-12-28 DIAGNOSIS — D649 Anemia, unspecified: Secondary | ICD-10-CM | POA: Diagnosis not present

## 2018-12-28 DIAGNOSIS — Z87891 Personal history of nicotine dependence: Secondary | ICD-10-CM | POA: Diagnosis not present

## 2018-12-28 DIAGNOSIS — O09219 Supervision of pregnancy with history of pre-term labor, unspecified trimester: Secondary | ICD-10-CM

## 2018-12-28 HISTORY — DX: Other specified health status: Z78.9

## 2018-12-28 NOTE — Procedures (Signed)
Carrie Alvarado 02/04/1991 [redacted]w[redacted]d  Fetus A Non-Stress Test Interpretation for 12/28/18  Indication: Hx IUFD  Fetal Heart Rate A Mode: External Baseline Rate (A): 135 bpm Variability: Moderate Accelerations: 15 x 15 Decelerations: Variable(1 with UC) Multiple birth?: No  Uterine Activity Mode: Palpation, Toco Contraction Frequency (min): 1 UC with UI Contraction Duration (sec): 20-70 Contraction Quality: Mild Resting Tone Palpated: Relaxed Resting Time: Adequate  Interpretation (Fetal Testing) Nonstress Test Interpretation: Reactive Overall Impression: Reassuring for gestational age Comments: EFM tracing reviewed by Dr. Grace Bushy Patient to MAU for further EFM

## 2018-12-28 NOTE — Discharge Instructions (Signed)

## 2018-12-28 NOTE — MAU Provider Note (Signed)
History     CSN: [id number]  Arrival date and time: 12/28/18 1140   First Provider Initiated Contact with Patient 12/28/18 1201      Chief Complaint  Patient presents with  . Non-stress Test   Carrie Alvarado is a 28 y.o. G3P1101 at [redacted]w[redacted]d who presents today from MFM for prolonged monitoring. She was seen in MFM today for antenatal testing 2/2 hx of IUFD with her last pregnancy. While on the monitor there she had one variable with a contraction. She did not have any prior to or after that while there. The remainder of the tracing was reactive with moderate variability and 15x15 accels. She denies any abdominal pain, vagianl bleeding or LOF. She reports normal fetal movement.    OB History    Gravida  3   Para  2   Term  1   Preterm  1   AB  0   Living  1     SAB  0   TAB  0   Ectopic  0   Multiple  0   Live Births  1           Past Medical History:  Diagnosis Date  . Medical history non-contributory   . No pertinent past medical history     Past Surgical History:  Procedure Laterality Date  . BREAST SURGERY     Implants  . CESAREAN SECTION    . CESAREAN SECTION  03/13/2012   Procedure: CESAREAN SECTION;  Surgeon: Lavina Hamman, MD;  Location: [facility];  Service: Gynecology;  Laterality: N/A;  Repeat cesarean section with delivery of baby girl at 58.  Apgars 9/9.  Marland Kitchen OTHER SURGICAL HISTORY     Glass removed in arm    Family History  Problem Relation Age of Onset  . Hypotension Neg Hx   . Anesthesia problems Neg Hx     Social History   Tobacco Use  . Smoking status: Former Smoker    Last attempt to quit: 05/25/2018    Years since quitting: 0.5  . Smokeless tobacco: Never Used  Substance Use Topics  . Alcohol use: No    Comment: not with pregnancy  . Drug use: No    Types: Marijuana    Comment: not with pregnancy    Allergies: No Known Allergies  Medications Prior to Admission  Medication Sig Dispense Refill Last Dose  . ferrous sulfate  (FERROUSUL) 325 (65 FE) MG tablet Take 1 tablet (325 mg total) by mouth 2 (two) times daily. 60 tablet 1 Taking  . Prenatal Multivit-Min-Fe-FA (PRENATAL VITAMINS PO) Take by mouth.   Taking  . terconazole (TERAZOL 7) 0.4 % vaginal cream Place 1 applicator vaginally at bedtime. (Patient not taking: Reported on 12/28/2018) 45 g 0 Not Taking    Review of Systems  All other systems reviewed and are negative.  Physical Exam   Blood pressure 120/70, pulse 86, temperature 97.8 F (36.6 C), temperature source Oral, resp. rate 20, last menstrual period 04/10/2018, SpO2 100 %.  Physical Exam  Nursing note and vitals reviewed. Constitutional: She is oriented to person, place, and time. She appears well-developed and well-nourished. No distress.  HENT:  Head: Normocephalic.  Cardiovascular: Normal rate.  Respiratory: Effort normal.  GI: Soft. There is no abdominal tenderness. There is no rebound.  Neurological: She is alert and oriented to person, place, and time.  Skin: Skin is warm and dry.  Psychiatric: She has a normal mood and affect.  NST:  Baseline: 135 Variability: moderate Accels: 15x15 Decels: none Toco: occasional     [name]  Procedures  MDM 2:32 PM patient has been here with > 2hours of monitoring. FHR has been reactive. No variables or decelerations. Occasional contractions, but patient comfortable without pain.  DW Dr. Earlene Plater, patient ok for DC home.    Assessment and Plan   1. NST (non-stress test) reactive   2. Anemia affecting pregnancy in third trimester   3. History of preterm delivery, currently pregnant   4. Supervision of high risk pregnancy, antepartum   5. [redacted] weeks gestation of pregnancy    DC home 3rd Trimester precautions  PTL precautions  Fetal kick counts RX: no new  Return to MAU as needed Keep all scheduled FU visits   Follow-up Information    [facility] Follow up.   Specialty:  Obstetrics and  Gynecology Contact information: [address] [place] [place] [address] [phone]          Thressa Sheller DNP, CNM  12/28/18  2:32 PM

## 2018-12-28 NOTE — ED Notes (Signed)
Patient to MAU for further EFM following variable decel w/UC. Report called to [facility] S[facility], RN, CN in MAU. Dr. Grace Bushy called report to provider.

## 2018-12-28 NOTE — MAU Note (Signed)
Sent from MFM for prolonged EFM secondary variable decels during NST.  Report +FM.  Denies VB or LOF.

## 2018-12-29 ENCOUNTER — Other Ambulatory Visit (HOSPITAL_COMMUNITY): Payer: Self-pay | Admitting: Maternal & Fetal Medicine

## 2018-12-29 DIAGNOSIS — Z8759 Personal history of other complications of pregnancy, childbirth and the puerperium: Secondary | ICD-10-CM

## 2019-01-04 ENCOUNTER — Encounter (HOSPITAL_COMMUNITY): Payer: Self-pay

## 2019-01-04 ENCOUNTER — Ambulatory Visit (INDEPENDENT_AMBULATORY_CARE_PROVIDER_SITE_OTHER): Payer: Medicaid Other | Admitting: Advanced Practice Midwife

## 2019-01-04 ENCOUNTER — Ambulatory Visit (HOSPITAL_COMMUNITY): Payer: Medicaid Other | Admitting: *Deleted

## 2019-01-04 ENCOUNTER — Other Ambulatory Visit: Payer: Self-pay

## 2019-01-04 ENCOUNTER — Ambulatory Visit (HOSPITAL_COMMUNITY)
Admission: RE | Admit: 2019-01-04 | Discharge: 2019-01-04 | Disposition: A | Discharge status: Home / Self Care | Payer: Medicaid Other | Source: Ambulatory Visit | Attending: Family | Admitting: Family

## 2019-01-04 VITALS — BP 109/71 | HR 77 | Wt 150.8 lb

## 2019-01-04 DIAGNOSIS — O99013 Anemia complicating pregnancy, third trimester: Secondary | ICD-10-CM | POA: Diagnosis present

## 2019-01-04 DIAGNOSIS — O09293 Supervision of pregnancy with other poor reproductive or obstetric history, third trimester: Secondary | ICD-10-CM

## 2019-01-04 DIAGNOSIS — Z3A36 36 weeks gestation of pregnancy: Secondary | ICD-10-CM | POA: Diagnosis not present

## 2019-01-04 DIAGNOSIS — O0990 Supervision of high risk pregnancy, unspecified, unspecified trimester: Secondary | ICD-10-CM

## 2019-01-04 DIAGNOSIS — O34219 Maternal care for unspecified type scar from previous cesarean delivery: Secondary | ICD-10-CM

## 2019-01-04 DIAGNOSIS — O09219 Supervision of pregnancy with history of pre-term labor, unspecified trimester: Secondary | ICD-10-CM

## 2019-01-04 DIAGNOSIS — O0993 Supervision of high risk pregnancy, unspecified, third trimester: Secondary | ICD-10-CM

## 2019-01-04 DIAGNOSIS — O99713 Diseases of the skin and subcutaneous tissue complicating pregnancy, third trimester: Secondary | ICD-10-CM

## 2019-01-04 DIAGNOSIS — O09899 Supervision of other high risk pregnancies, unspecified trimester: Secondary | ICD-10-CM

## 2019-01-04 DIAGNOSIS — Z8759 Personal history of other complications of pregnancy, childbirth and the puerperium: Secondary | ICD-10-CM

## 2019-01-04 DIAGNOSIS — O09213 Supervision of pregnancy with history of pre-term labor, third trimester: Secondary | ICD-10-CM

## 2019-01-04 DIAGNOSIS — L282 Other prurigo: Secondary | ICD-10-CM

## 2019-01-04 DIAGNOSIS — N898 Other specified noninflammatory disorders of vagina: Secondary | ICD-10-CM

## 2019-01-04 DIAGNOSIS — B3731 Acute candidiasis of vulva and vagina: Secondary | ICD-10-CM

## 2019-01-04 DIAGNOSIS — B373 Candidiasis of vulva and vagina: Secondary | ICD-10-CM

## 2019-01-04 MED ORDER — TERCONAZOLE 0.4 % VA CREA: 1.0000 | TOPICAL_CREAM | Freq: Every day | VAGINAL | 0 refills | Status: DC

## 2019-01-04 NOTE — Patient Instructions (Signed)
Contraception Choices  Contraception, also called birth control, refers to methods or devices that prevent pregnancy.  Hormonal methods  Contraceptive implant    A contraceptive implant is a thin, plastic tube that contains a hormone. It is inserted into the upper part of the arm. It can remain in place for up to 3 years.  Progestin-only injections  Progestin-only injections are injections of progestin, a synthetic form of the hormone progesterone. They are given every 3 months by a health care provider.  Birth control pills    Birth control pills are pills that contain hormones that prevent pregnancy. They must be taken once a day, preferably at the same time each day.  Birth control patch    The birth control patch contains hormones that prevent pregnancy. It is placed on the skin and must be changed once a week for three weeks and removed on the fourth week. A prescription is needed to use this method of contraception.  Vaginal ring    A vaginal ring contains hormones that prevent pregnancy. It is placed in the vagina for three weeks and removed on the fourth week. After that, the process is repeated with a new ring. A prescription is needed to use this method of contraception.  Emergency contraceptive  Emergency contraceptives prevent pregnancy after unprotected sex. They come in pill form and can be taken up to 5 days after sex. They work best the sooner they are taken after having sex. Most emergency contraceptives are available without a prescription. This method should not be used as your only form of birth control.  Barrier methods  Female condom    A female condom is a thin sheath that is worn over the penis during sex. Condoms keep sperm from going inside a woman's body. They can be used with a spermicide to increase their effectiveness. They should be disposed after a single use.  Female condom    A female condom is a soft, loose-fitting sheath that is put into the vagina before sex. The condom keeps sperm  from going inside a woman's body. They should be disposed after a single use.  Diaphragm    A diaphragm is a soft, dome-shaped barrier. It is inserted into the vagina before sex, along with a spermicide. The diaphragm blocks sperm from entering the uterus, and the spermicide kills sperm. A diaphragm should be left in the vagina for 6-8 hours after sex and removed within 24 hours.  A diaphragm is prescribed and fitted by a health care provider. A diaphragm should be replaced every 1-2 years, after giving birth, after gaining more than 15 lb (6.8 kg), and after pelvic surgery.  Cervical cap    A cervical cap is a round, soft latex or plastic cup that fits over the cervix. It is inserted into the vagina before sex, along with spermicide. It blocks sperm from entering the uterus. The cap should be left in place for 6-8 hours after sex and removed within 48 hours. A cervical cap must be prescribed and fitted by a health care provider. It should be replaced every 2 years.  Sponge    A sponge is a soft, circular piece of polyurethane foam with spermicide on it. The sponge helps block sperm from entering the uterus, and the spermicide kills sperm. To use it, you make it wet and then insert it into the vagina. It should be inserted before sex, left in for at least 6 hours after sex, and removed and thrown away within   30 hours.  Spermicides  Spermicides are chemicals that kill or block sperm from entering the cervix and uterus. They can come as a cream, jelly, suppository, foam, or tablet. A spermicide should be inserted into the vagina with an applicator at least 10-15 minutes before sex to allow time for it to work. The process must be repeated every time you have sex. Spermicides do not require a prescription.  Intrauterine contraception  Intrauterine device (IUD)  An IUD is a T-shaped device that is put in a woman's uterus. There are two types:   Hormone IUD.This type contains progestin, a synthetic form of the hormone  progesterone. This type can stay in place for 3-5 years.   Copper IUD.This type is wrapped in copper wire. It can stay in place for 10 years.    Permanent methods of contraception  Female tubal ligation  In this method, a woman's fallopian tubes are sealed, tied, or blocked during surgery to prevent eggs from traveling to the uterus.  Hysteroscopic sterilization  In this method, a small, flexible insert is placed into each fallopian tube. The inserts cause scar tissue to form in the fallopian tubes and block them, so sperm cannot reach an egg. The procedure takes about 3 months to be effective. Another form of birth control must be used during those 3 months.  Female sterilization  This is a procedure to tie off the tubes that carry sperm (vasectomy). After the procedure, the man can still ejaculate fluid (semen).  Natural planning methods  Natural family planning  In this method, a couple does not have sex on days when the woman could become pregnant.  Calendar method  This means keeping track of the length of each menstrual cycle, identifying the days when pregnancy can happen, and not having sex on those days.  Ovulation method  In this method, a couple avoids sex during ovulation.  Symptothermal method  This method involves not having sex during ovulation. The woman typically checks for ovulation by watching changes in her temperature and in the consistency of cervical mucus.  Post-ovulation method  In this method, a couple waits to have sex until after ovulation.  Summary   Contraception, also called birth control, means methods or devices that prevent pregnancy.   Hormonal methods of contraception include implants, injections, pills, patches, vaginal rings, and emergency contraceptives.   Barrier methods of contraception can include female condoms, female condoms, diaphragms, cervical caps, sponges, and spermicides.   There are two types of IUDs (intrauterine devices). An IUD can be put in a woman's uterus to  prevent pregnancy for 3-5 years.   Permanent sterilization can be done through a procedure for males, females, or both.   Natural family planning methods involve not having sex on days when the woman could become pregnant.  This information is not intended to replace advice given to you by your health care provider. Make sure you discuss any questions you have with your health care provider.  Document Released: 10/26/2005 Document Revised: 10/28/2017 Document Reviewed: 11/28/2016  Elsevier Interactive Patient Education  2019 Elsevier Inc.

## 2019-01-04 NOTE — Progress Notes (Signed)
   PRENATAL VISIT NOTE  Subjective:  Carrie Alvarado is a 28 y.o. G3P1101 at [redacted]w[redacted]d being seen today for ongoing prenatal care.  She is currently monitored for the following issues for this high-risk pregnancy and has History of cesarean delivery affecting pregnancy; Supervision of high risk pregnancy, antepartum; History of preterm delivery, currently pregnant; Anemia affecting pregnancy; History of IUFD; and Prurigo of pregnancy in third trimester on their problem list.  Patient reports vaginal discharge and pruritis. Took Terazol after last visit for yeast infection. Bile acids and LFTs were Nml at last visit. Denies rash. .   .  .   . Denies leaking of fluid.   The following portions of the patient's history were reviewed and updated as appropriate: allergies, current medications, past family history, past medical history, past social history, past surgical history and problem list. Problem list updated.  Objective:  There were no vitals filed for this visit.  Fetal Status:           General:  Alert, oriented and cooperative. Patient is in no acute distress.  Skin: Skin is warm and dry. No rash noted.   Cardiovascular: Normal heart rate noted  Respiratory: Normal respiratory effort, no problems with respiration noted  Abdomen: Soft, gravid, appropriate for gestational age.        Pelvic: Cervical exam performed closed/thick, vtx, soft        Extremities: Normal range of motion.     Mental Status: Normal mood and affect. Normal behavior. Normal judgment and thought content.   Assessment and Plan:  Pregnancy: G3P1101 at 106w6d  1. Anemia affecting pregnancy in third trimester - Continue PO iron   2. Pruritis  - Nml ICP labs last visit. Will repeat today due to ongoing Sx.   3. Supervision of high risk pregnancy, antepartum   4. History of cesarean delivery affecting pregnancy - X 2. Both Low transverse. G1 for fetal indications. G2 planned repeat. Pt strongly desires VBAC--Will  TOLAC if SOL before 39 weeks. Otherwise 39 week C/S scheduled. Explained that not all providers are comfortable w/ V2BAC.   5. History of IUFD - BPP today  6. VVC - Did not complete course of Terazol. Encouraged her to do so if discharge and itching are bothering her.   Term labor symptoms and general obstetric precautions including but not limited to vaginal bleeding, contractions, leaking of fluid and fetal movement were reviewed in detail with the patient. Please refer to After Visit Summary for other counseling recommendations.  No follow-ups on file.  Future Appointments  Date Time Provider Department Center  01/04/2019  8:10 AM Katrinka Blazing [place] [facility] None  01/04/2019 10:20 AM [facility] NURSE [facility]  01/04/2019 10:30 AM [facility] [place] 1 [facility]-US  01/11/2019  8:10 AM Raelyn Mora, CNM [facility] None  01/11/2019  9:30 AM [facility] NURSE [facility]  01/11/2019  9:45 AM [facility] [place] 5 [facility] M[facility]-US  01/19/2019  8:10 AM Sharyon Cable, CNM [facility] None  01/25/2019  8:10 AM Raelyn Mora, CNM [facility] None    Dorathy Kinsman, CNM

## 2019-01-07 LAB — COMPREHENSIVE METABOLIC PANEL
ALBUMIN: 3.3 g/dL — AB (ref 3.9–5.0)
ALK PHOS: 180 IU/L — AB (ref 39–117)
ALT: 15 IU/L (ref 0–32)
AST: 13 IU/L (ref 0–40)
Albumin/Globulin Ratio: 1.3 (ref 1.2–2.2)
BILIRUBIN TOTAL: 0.3 mg/dL (ref 0.0–1.2)
BUN/Creatinine Ratio: 21 (ref 9–23)
BUN: 8 mg/dL (ref 6–20)
CO2: 19 mmol/L — ABNORMAL LOW (ref 20–29)
Calcium: 8.7 mg/dL (ref 8.7–10.2)
Chloride: 105 mmol/L (ref 96–106)
Creatinine, Ser: 0.38 mg/dL — ABNORMAL LOW (ref 0.57–1.00)
GFR calc Af Amer: 170 mL/min/{1.73_m2} (ref >59)
GFR calc non Af Amer: 148 mL/min/{1.73_m2} (ref >59)
Globulin, Total: 2.5 g/dL (ref 1.5–4.5)
Glucose: 76 mg/dL (ref 65–99)
Potassium: 4.2 mmol/L (ref 3.5–5.2)
Sodium: 135 mmol/L (ref 134–144)
Total Protein: 5.8 g/dL — ABNORMAL LOW (ref 6.0–8.5)

## 2019-01-07 LAB — CULTURE, BETA STREP (GROUP B ONLY): Strep Gp B Culture: POSITIVE — AB

## 2019-01-07 LAB — BILE ACIDS, TOTAL: Bile Acids Total: 5.2 umol/L (ref 0.0–10.0)

## 2019-01-09 ENCOUNTER — Telehealth: Payer: Self-pay | Admitting: *Deleted

## 2019-01-09 ENCOUNTER — Telehealth (HOSPITAL_COMMUNITY): Payer: Self-pay | Admitting: *Deleted

## 2019-01-09 ENCOUNTER — Encounter: Payer: Self-pay | Admitting: Advanced Practice Midwife

## 2019-01-09 DIAGNOSIS — O99820 Streptococcus B carrier state complicating pregnancy: Secondary | ICD-10-CM | POA: Insufficient documentation

## 2019-01-09 NOTE — Telephone Encounter (Signed)
-----   Message from Olevia Bowens sent at 01/09/2019 10:38 AM EST -----  ----- Message ----- From: Dorathy Kinsman, CNM Sent: 01/09/2019  10:36 AM EST To: [facility]  Please inform pt of negative tests for Cholestasis. Continue current POC. May take OTC Benadryl 25-50 mg PO Q6 PRN for itching. May cause drowsiness.  GBS is positive. Plan PCN labor.

## 2019-01-09 NOTE — Telephone Encounter (Signed)
Preadmission screen  

## 2019-01-09 NOTE — Telephone Encounter (Signed)
Tried to call patient regarding results. Unable to leave voice message; mailbox full.   Clovis Pu, RN

## 2019-01-10 ENCOUNTER — Telehealth (HOSPITAL_COMMUNITY): Payer: Self-pay | Admitting: *Deleted

## 2019-01-10 NOTE — Telephone Encounter (Signed)
Preadmission screen  

## 2019-01-10 NOTE — Telephone Encounter (Signed)
Tried to call patient, unable to leave a message; mailbox full.  Clovis Pu, RN

## 2019-01-11 ENCOUNTER — Ambulatory Visit (HOSPITAL_COMMUNITY)
Admission: RE | Admit: 2019-01-11 | Discharge: 2019-01-11 | Disposition: A | Discharge status: Home / Self Care | Payer: Medicaid Other | Source: Ambulatory Visit | Attending: Advanced Practice Midwife | Admitting: Advanced Practice Midwife

## 2019-01-11 ENCOUNTER — Ambulatory Visit (INDEPENDENT_AMBULATORY_CARE_PROVIDER_SITE_OTHER): Payer: Medicaid Other | Admitting: Obstetrics and Gynecology

## 2019-01-11 ENCOUNTER — Other Ambulatory Visit: Payer: Self-pay

## 2019-01-11 ENCOUNTER — Ambulatory Visit (HOSPITAL_COMMUNITY): Payer: Medicaid Other | Admitting: *Deleted

## 2019-01-11 ENCOUNTER — Encounter (HOSPITAL_COMMUNITY): Payer: Self-pay

## 2019-01-11 ENCOUNTER — Telehealth (HOSPITAL_COMMUNITY): Payer: Self-pay | Admitting: *Deleted

## 2019-01-11 VITALS — BP 107/61 | HR 75 | Wt 152.6 lb

## 2019-01-11 VITALS — BP 127/63 | HR 75 | Wt 151.4 lb

## 2019-01-11 DIAGNOSIS — O34219 Maternal care for unspecified type scar from previous cesarean delivery: Secondary | ICD-10-CM | POA: Diagnosis not present

## 2019-01-11 DIAGNOSIS — O09293 Supervision of pregnancy with other poor reproductive or obstetric history, third trimester: Secondary | ICD-10-CM

## 2019-01-11 DIAGNOSIS — Z362 Encounter for other antenatal screening follow-up: Secondary | ICD-10-CM

## 2019-01-11 DIAGNOSIS — O0993 Supervision of high risk pregnancy, unspecified, third trimester: Secondary | ICD-10-CM

## 2019-01-11 DIAGNOSIS — Z3A37 37 weeks gestation of pregnancy: Secondary | ICD-10-CM | POA: Diagnosis not present

## 2019-01-11 NOTE — Telephone Encounter (Signed)
Patient in clinic for an appointment. Patient advised of normal lab results.  Clovis Pu, RN

## 2019-01-11 NOTE — Telephone Encounter (Signed)
Preadmission screen  

## 2019-01-11 NOTE — Progress Notes (Addendum)
   PRENATAL VISIT NOTE  Subjective:  Carrie Alvarado is a 28 y.o. G3P1101 at [redacted]w[redacted]d being seen today for ongoing prenatal care.  She is currently monitored for the following issues for this high-risk pregnancy and has History of cesarean delivery affecting pregnancy; Supervision of high risk pregnancy, antepartum; History of preterm delivery, currently pregnant; Anemia affecting pregnancy; History of IUFD; Prurigo of pregnancy in third trimester; and Group B Streptococcus carrier, antepartum on their problem list.  Patient reports occasional Braxton-Hicks contractions, watery discharge that does not cause vaginal itching/irritation, and itching primarily on her belly.  Contractions: Irritability. Vag. Bleeding: None.  Movement: Present. Denies leaking of fluid.   The following portions of the patient's history were reviewed and updated as appropriate: allergies, current medications, past family history, past medical history, past social history, past surgical history and problem list. Problem list updated.  Objective:   Vitals:   01/11/19 0818  BP: 127/63  Pulse: 75  Weight: 68.7 kg   Bile acids went from 9.0 (3wks ago) to 5.2 last week.  Fetal Status: Fetal Heart Rate (bpm): 150 Fundal Height: 37 cm Movement: Present     General:  Alert, oriented and cooperative. Patient is in no acute distress.  Skin: Skin is warm and dry. No rash noted.   Cardiovascular: Normal heart rate noted  Respiratory: Normal respiratory effort, no problems with respiration noted  Abdomen: Soft, gravid, appropriate for gestational age.  Pain/Pressure: Present     Pelvic: Cervical exam deferred        Extremities: Normal range of motion.  Edema: None  Mental Status: Normal mood and affect. Normal behavior. Normal judgment and thought content.   Assessment and Plan:  Pregnancy: G3P1101 at [redacted]w[redacted]d  1. Supervision of high risk pregnancy in third trimester - Advised her to use a thick moisturizer on her belly, and  do nightly stretching to increase blood flow to her hands/feet. - Scheduled CS for 01/19/2019 - PP follow-up in 5 weeks  Term labor symptoms and general obstetric precautions including but not limited to vaginal bleeding, contractions, leaking of fluid and fetal movement were reviewed in detail with the patient. Please refer to After Visit Summary for other counseling recommendations.  Return in about 5 weeks (around 02/15/2019) for PP.  Future Appointments  Date Time Provider Department Center  01/11/2019  9:30 AM [facility] NURSE [facility]  01/11/2019  9:45 AM [facility] [place] 5 [facility]-US  01/18/2019 10:00 AM [facility] PAT 1 [facility] None  02/24/2019 10:30 AM Karim-Rhoades, Kae Heller, CNM [facility] None    Bernerd Limbo, Student-MidWife

## 2019-01-13 ENCOUNTER — Telehealth: Payer: Self-pay | Admitting: Family

## 2019-01-13 NOTE — Telephone Encounter (Signed)
Pt can have TOLAC if presents in labor prior to scheduled csection.

## 2019-01-13 NOTE — Telephone Encounter (Signed)
-----   Message from Marti Sleigh, [place] sent at 01/12/2019  4:29 PM EST ----- Regarding: c/s Patient called wanting to know if its possible for her to try to have a vaginal delivery.  I told her that I would send you a message and that someone would give her a call back.  Her C/S is scheduled for 01/19/19.

## 2019-01-16 ENCOUNTER — Encounter (HOSPITAL_COMMUNITY): Payer: Self-pay

## 2019-01-17 NOTE — Patient Instructions (Signed)
Carrie Alvarado  01/17/2019   Your procedure is scheduled on:  01/19/2019  Arrive at 0845 at Entrance C on [address] at [facility] and [employer]. You are invited to use the FREE valet parking or use the Visitor's parking deck.  Pick up the phone at the desk and dial [phone].  Call this number if you have problems the morning of surgery: [phone]  Remember:   Do not eat food:(After Midnight) Desps de medianoche.  Do not drink clear liquids: (After Midnight) Desps de medianoche.  Take these medicines the morning of surgery with A SIP OF WATER:  none   Do not wear jewelry, make-up or nail polish.  Do not wear lotions, powders, or perfumes. Do not wear deodorant.  Do not shave 48 hours prior to surgery.  Do not bring valuables to the hospital.  [facility] is not   responsible for any belongings or valuables brought to the hospital.  Contacts, dentures or bridgework may not be worn into surgery.  Leave suitcase in the car. After surgery it may be brought to your room.  For patients admitted to the hospital, checkout time is 11:00 AM the day of              discharge.      Please read over the following fact sheets that you were given:     Preparing for Surgery

## 2019-01-18 ENCOUNTER — Encounter (HOSPITAL_COMMUNITY)
Admission: RE | Admit: 2019-01-18 | Discharge: 2019-01-18 | Disposition: A | Discharge status: Home / Self Care | Payer: Medicaid Other | Source: Ambulatory Visit | Attending: Obstetrics and Gynecology | Admitting: Obstetrics and Gynecology

## 2019-01-18 ENCOUNTER — Encounter (HOSPITAL_COMMUNITY): Payer: Self-pay | Admitting: Obstetrics & Gynecology

## 2019-01-18 LAB — TYPE AND SCREEN
ABO/RH(D): O POS
Antibody Screen: NEGATIVE

## 2019-01-18 LAB — ABO/RH: ABO/RH(D): O POS

## 2019-01-18 LAB — CBC
HCT: 34.8 % — ABNORMAL LOW (ref 36.0–46.0)
Hemoglobin: 10.7 g/dL — ABNORMAL LOW (ref 12.0–15.0)
MCH: 24.6 pg — AB (ref 26.0–34.0)
MCHC: 30.7 g/dL (ref 30.0–36.0)
MCV: 80 fL (ref 80.0–100.0)
Platelets: 290 10*3/uL (ref 150–400)
RBC: 4.35 MIL/uL (ref 3.87–5.11)
RDW: 16.3 % — ABNORMAL HIGH (ref 11.5–15.5)
WBC: 9.9 10*3/uL (ref 4.0–10.5)
nRBC: 0.4 % — ABNORMAL HIGH (ref 0.0–0.2)

## 2019-01-19 ENCOUNTER — Encounter (HOSPITAL_COMMUNITY)
Admission: RE | Disposition: A | Discharge status: Home / Self Care | Payer: Self-pay | Source: Home / Self Care | Attending: Obstetrics & Gynecology

## 2019-01-19 ENCOUNTER — Inpatient Hospital Stay (HOSPITAL_COMMUNITY): Payer: Medicaid Other | Admitting: Certified Registered Nurse Anesthetist

## 2019-01-19 ENCOUNTER — Encounter: Payer: Medicaid Other | Admitting: Certified Nurse Midwife

## 2019-01-19 ENCOUNTER — Inpatient Hospital Stay (HOSPITAL_COMMUNITY)
Admission: RE | Admit: 2019-01-19 | Discharge: 2019-01-21 | DRG: 785 | Disposition: A | Discharge status: Home / Self Care | Payer: Medicaid Other | Attending: Obstetrics & Gynecology | Admitting: Obstetrics & Gynecology

## 2019-01-19 ENCOUNTER — Encounter (HOSPITAL_COMMUNITY): Payer: Self-pay | Admitting: *Deleted

## 2019-01-19 DIAGNOSIS — Z98891 History of uterine scar from previous surgery: Secondary | ICD-10-CM

## 2019-01-19 DIAGNOSIS — O99824 Streptococcus B carrier state complicating childbirth: Secondary | ICD-10-CM | POA: Diagnosis present

## 2019-01-19 DIAGNOSIS — O34211 Maternal care for low transverse scar from previous cesarean delivery: Principal | ICD-10-CM | POA: Diagnosis present

## 2019-01-19 DIAGNOSIS — Z3A39 39 weeks gestation of pregnancy: Secondary | ICD-10-CM

## 2019-01-19 DIAGNOSIS — D649 Anemia, unspecified: Secondary | ICD-10-CM | POA: Diagnosis present

## 2019-01-19 DIAGNOSIS — O0990 Supervision of high risk pregnancy, unspecified, unspecified trimester: Secondary | ICD-10-CM

## 2019-01-19 DIAGNOSIS — O9902 Anemia complicating childbirth: Secondary | ICD-10-CM | POA: Diagnosis present

## 2019-01-19 DIAGNOSIS — Z87891 Personal history of nicotine dependence: Secondary | ICD-10-CM | POA: Diagnosis not present

## 2019-01-19 DIAGNOSIS — Z302 Encounter for sterilization: Secondary | ICD-10-CM

## 2019-01-19 LAB — RPR: RPR Ser Ql: NONREACTIVE

## 2019-01-19 SURGERY — Surgical Case
Anesthesia: Spinal | Laterality: Bilateral

## 2019-01-19 MED ORDER — OXYTOCIN 40 UNITS IN NORMAL SALINE INFUSION - SIMPLE MED
INTRAVENOUS | Status: AC
  Filled 2019-01-19: qty 1000

## 2019-01-19 MED ORDER — SENNOSIDES-DOCUSATE SODIUM 8.6-50 MG PO TABS
2.0000 | ORAL_TABLET | ORAL | Status: DC
  Administered 2019-01-19 – 2019-01-20 (×2): 2 via ORAL
  Filled 2019-01-19 (×2): qty 2

## 2019-01-19 MED ORDER — NALBUPHINE HCL 10 MG/ML IJ SOLN
5.0000 mg | Freq: Once | INTRAMUSCULAR | Status: AC | PRN
  Administered 2019-01-19: 5 mg via INTRAVENOUS
  Filled 2019-01-19: qty 1

## 2019-01-19 MED ORDER — SODIUM CHLORIDE 0.9% FLUSH: 3.0000 mL | INTRAVENOUS | Status: DC | PRN

## 2019-01-19 MED ORDER — DIBUCAINE 1 % RE OINT: 1.0000 "application " | TOPICAL_OINTMENT | RECTAL | Status: DC | PRN

## 2019-01-19 MED ORDER — CEFAZOLIN SODIUM-DEXTROSE 2-4 GM/100ML-% IV SOLN: 2.0000 g | INTRAVENOUS | Status: DC

## 2019-01-19 MED ORDER — TETANUS-DIPHTH-ACELL PERTUSSIS 5-2.5-18.5 LF-MCG/0.5 IM SUSP: 0.5000 mL | Freq: Once | INTRAMUSCULAR | Status: DC

## 2019-01-19 MED ORDER — KETOROLAC TROMETHAMINE 30 MG/ML IJ SOLN
30.0000 mg | Freq: Four times a day (QID) | INTRAMUSCULAR | Status: AC | PRN
  Administered 2019-01-19: 30 mg via INTRAMUSCULAR

## 2019-01-19 MED ORDER — ACETAMINOPHEN 500 MG PO TABS
ORAL_TABLET | ORAL | Status: AC
  Filled 2019-01-19: qty 2

## 2019-01-19 MED ORDER — DIPHENHYDRAMINE HCL 50 MG/ML IJ SOLN: 12.5000 mg | INTRAMUSCULAR | Status: DC | PRN

## 2019-01-19 MED ORDER — MORPHINE SULFATE (PF) 0.5 MG/ML IJ SOLN
INTRAMUSCULAR | Status: DC | PRN
  Administered 2019-01-19: .15 mg via INTRATHECAL

## 2019-01-19 MED ORDER — DIPHENHYDRAMINE HCL 25 MG PO CAPS: 25.0000 mg | ORAL_CAPSULE | Freq: Four times a day (QID) | ORAL | Status: DC | PRN

## 2019-01-19 MED ORDER — SODIUM CHLORIDE 0.9 % IV SOLN
2.0000 g | Freq: Once | INTRAVENOUS | Status: AC
  Administered 2019-01-19: 2 g via INTRAVENOUS

## 2019-01-19 MED ORDER — WITCH HAZEL-GLYCERIN EX PADS: 1.0000 "application " | MEDICATED_PAD | CUTANEOUS | Status: DC | PRN

## 2019-01-19 MED ORDER — ONDANSETRON HCL 4 MG/2ML IJ SOLN: 4.0000 mg | Freq: Three times a day (TID) | INTRAMUSCULAR | Status: DC | PRN

## 2019-01-19 MED ORDER — BUPIVACAINE IN DEXTROSE 0.75-8.25 % IT SOLN
INTRATHECAL | Status: DC | PRN
  Administered 2019-01-19: 1.3 mL via INTRATHECAL

## 2019-01-19 MED ORDER — ACETAMINOPHEN 500 MG PO TABS
1000.0000 mg | ORAL_TABLET | Freq: Once | ORAL | Status: AC
  Administered 2019-01-19: 1000 mg via ORAL

## 2019-01-19 MED ORDER — DEXAMETHASONE SODIUM PHOSPHATE 4 MG/ML IJ SOLN
INTRAMUSCULAR | Status: DC | PRN
  Administered 2019-01-19: 4 mg via INTRAVENOUS

## 2019-01-19 MED ORDER — DEXAMETHASONE SODIUM PHOSPHATE 10 MG/ML IJ SOLN
INTRAMUSCULAR | Status: AC
  Filled 2019-01-19: qty 1

## 2019-01-19 MED ORDER — FENTANYL CITRATE (PF) 100 MCG/2ML IJ SOLN
INTRAMUSCULAR | Status: AC
  Filled 2019-01-19: qty 2

## 2019-01-19 MED ORDER — SIMETHICONE 80 MG PO CHEW
80.0000 mg | CHEWABLE_TABLET | ORAL | Status: DC
  Administered 2019-01-20: 80 mg via ORAL
  Filled 2019-01-19 (×2): qty 1

## 2019-01-19 MED ORDER — GABAPENTIN 100 MG PO CAPS
300.0000 mg | ORAL_CAPSULE | Freq: Two times a day (BID) | ORAL | Status: DC
  Administered 2019-01-19 – 2019-01-21 (×4): 300 mg via ORAL
  Filled 2019-01-19 (×4): qty 3

## 2019-01-19 MED ORDER — GABAPENTIN 300 MG PO CAPS
ORAL_CAPSULE | ORAL | Status: AC
  Filled 2019-01-19: qty 3

## 2019-01-19 MED ORDER — ONDANSETRON HCL 4 MG/2ML IJ SOLN
INTRAMUSCULAR | Status: DC | PRN
  Administered 2019-01-19: 4 mg via INTRAVENOUS

## 2019-01-19 MED ORDER — MEPERIDINE HCL 25 MG/ML IJ SOLN: 6.2500 mg | INTRAMUSCULAR | Status: DC | PRN

## 2019-01-19 MED ORDER — KETOROLAC TROMETHAMINE 30 MG/ML IJ SOLN
INTRAMUSCULAR | Status: AC
  Filled 2019-01-19: qty 1

## 2019-01-19 MED ORDER — ONDANSETRON HCL 4 MG/2ML IJ SOLN
INTRAMUSCULAR | Status: AC
  Filled 2019-01-19: qty 2

## 2019-01-19 MED ORDER — ENOXAPARIN SODIUM 40 MG/0.4ML ~~LOC~~ SOLN
40.0000 mg | SUBCUTANEOUS | Status: DC
  Administered 2019-01-20 – 2019-01-21 (×2): 40 mg via SUBCUTANEOUS
  Filled 2019-01-19 (×2): qty 0.4

## 2019-01-19 MED ORDER — NALOXONE HCL 0.4 MG/ML IJ SOLN: 0.4000 mg | INTRAMUSCULAR | Status: DC | PRN

## 2019-01-19 MED ORDER — FENTANYL CITRATE (PF) 100 MCG/2ML IJ SOLN
INTRAMUSCULAR | Status: DC | PRN
  Administered 2019-01-19: 15 ug via INTRATHECAL

## 2019-01-19 MED ORDER — PHENYLEPHRINE HCL-NACL 20-0.9 MG/250ML-% IV SOLN
INTRAVENOUS | Status: DC | PRN
  Administered 2019-01-19: 60 ug/min via INTRAVENOUS

## 2019-01-19 MED ORDER — ZOLPIDEM TARTRATE 5 MG PO TABS: 5.0000 mg | ORAL_TABLET | Freq: Every evening | ORAL | Status: DC | PRN

## 2019-01-19 MED ORDER — MEASLES, MUMPS & RUBELLA VAC IJ SOLR: 0.5000 mL | Freq: Once | INTRAMUSCULAR | Status: DC

## 2019-01-19 MED ORDER — OXYTOCIN 40 UNITS IN NORMAL SALINE INFUSION - SIMPLE MED: 2.5000 [IU]/h | INTRAVENOUS | Status: AC

## 2019-01-19 MED ORDER — HYDROMORPHONE HCL 1 MG/ML IJ SOLN: 1.0000 mg | INTRAMUSCULAR | Status: DC | PRN

## 2019-01-19 MED ORDER — OXYCODONE-ACETAMINOPHEN 5-325 MG PO TABS
2.0000 | ORAL_TABLET | ORAL | Status: DC | PRN
  Administered 2019-01-21: 2 via ORAL
  Filled 2019-01-19: qty 2

## 2019-01-19 MED ORDER — SODIUM CHLORIDE 0.9 % IV SOLN
INTRAVENOUS | Status: DC | PRN
  Administered 2019-01-19: 12:00:00 via INTRAVENOUS

## 2019-01-19 MED ORDER — NALBUPHINE HCL 10 MG/ML IJ SOLN: 5.0000 mg | INTRAMUSCULAR | Status: DC | PRN

## 2019-01-19 MED ORDER — COCONUT OIL OIL
1.0000 "application " | TOPICAL_OIL | Status: DC | PRN
  Administered 2019-01-19: 1 via TOPICAL

## 2019-01-19 MED ORDER — SCOPOLAMINE 1 MG/3DAYS TD PT72
MEDICATED_PATCH | TRANSDERMAL | Status: AC
  Filled 2019-01-19: qty 1

## 2019-01-19 MED ORDER — IBUPROFEN 800 MG PO TABS
800.0000 mg | ORAL_TABLET | Freq: Four times a day (QID) | ORAL | Status: DC
  Administered 2019-01-20 – 2019-01-21 (×5): 800 mg via ORAL
  Filled 2019-01-19 (×5): qty 1

## 2019-01-19 MED ORDER — DIPHENHYDRAMINE HCL 25 MG PO CAPS: 25.0000 mg | ORAL_CAPSULE | ORAL | Status: DC | PRN

## 2019-01-19 MED ORDER — PHENYLEPHRINE HCL-NACL 20-0.9 MG/250ML-% IV SOLN
INTRAVENOUS | Status: AC
  Filled 2019-01-19: qty 250

## 2019-01-19 MED ORDER — LACTATED RINGERS IV SOLN
INTRAVENOUS | Status: DC | PRN
  Administered 2019-01-19 (×2): via INTRAVENOUS

## 2019-01-19 MED ORDER — BUPIVACAINE HCL (PF) 0.5 % IJ SOLN
INTRAMUSCULAR | Status: AC
  Filled 2019-01-19: qty 30

## 2019-01-19 MED ORDER — ACETAMINOPHEN 500 MG PO TABS
1000.0000 mg | ORAL_TABLET | Freq: Four times a day (QID) | ORAL | Status: AC
  Administered 2019-01-19 – 2019-01-20 (×2): 1000 mg via ORAL
  Filled 2019-01-19 (×2): qty 2

## 2019-01-19 MED ORDER — HYDROMORPHONE HCL 1 MG/ML IJ SOLN: 0.2500 mg | INTRAMUSCULAR | Status: DC | PRN

## 2019-01-19 MED ORDER — KETOROLAC TROMETHAMINE 30 MG/ML IJ SOLN: 30.0000 mg | Freq: Once | INTRAMUSCULAR | Status: DC | PRN

## 2019-01-19 MED ORDER — SCOPOLAMINE 1 MG/3DAYS TD PT72: 1.0000 | MEDICATED_PATCH | Freq: Once | TRANSDERMAL | Status: DC

## 2019-01-19 MED ORDER — MORPHINE SULFATE (PF) 0.5 MG/ML IJ SOLN
INTRAMUSCULAR | Status: AC
  Filled 2019-01-19: qty 10

## 2019-01-19 MED ORDER — SIMETHICONE 80 MG PO CHEW
80.0000 mg | CHEWABLE_TABLET | ORAL | Status: DC | PRN
  Administered 2019-01-19: 80 mg via ORAL

## 2019-01-19 MED ORDER — FERROUS SULFATE 325 (65 FE) MG PO TABS
325.0000 mg | ORAL_TABLET | Freq: Two times a day (BID) | ORAL | Status: DC
  Administered 2019-01-20: 325 mg via ORAL
  Filled 2019-01-19: qty 1

## 2019-01-19 MED ORDER — MENTHOL 3 MG MT LOZG
1.0000 | LOZENGE | OROMUCOSAL | Status: DC | PRN
  Administered 2019-01-20: 3 mg via ORAL
  Filled 2019-01-19: qty 9

## 2019-01-19 MED ORDER — KETOROLAC TROMETHAMINE 30 MG/ML IJ SOLN
30.0000 mg | Freq: Four times a day (QID) | INTRAMUSCULAR | Status: AC
  Administered 2019-01-19 – 2019-01-20 (×2): 30 mg via INTRAVENOUS
  Filled 2019-01-19 (×2): qty 1

## 2019-01-19 MED ORDER — NALOXONE HCL 4 MG/10ML IJ SOLN
1.0000 ug/kg/h | INTRAVENOUS | Status: DC | PRN
  Filled 2019-01-19: qty 5

## 2019-01-19 MED ORDER — TRAMADOL HCL 50 MG PO TABS: 50.0000 mg | ORAL_TABLET | Freq: Four times a day (QID) | ORAL | Status: DC | PRN

## 2019-01-19 MED ORDER — KETOROLAC TROMETHAMINE 30 MG/ML IJ SOLN: 30.0000 mg | Freq: Four times a day (QID) | INTRAMUSCULAR | Status: AC | PRN

## 2019-01-19 MED ORDER — PROMETHAZINE HCL 25 MG/ML IJ SOLN: 6.2500 mg | INTRAMUSCULAR | Status: DC | PRN

## 2019-01-19 MED ORDER — OXYCODONE-ACETAMINOPHEN 5-325 MG PO TABS: 1.0000 | ORAL_TABLET | ORAL | Status: DC | PRN

## 2019-01-19 MED ORDER — LACTATED RINGERS IV SOLN
INTRAVENOUS | Status: DC
  Administered 2019-01-19: 18:00:00 via INTRAVENOUS

## 2019-01-19 MED ORDER — MAGNESIUM HYDROXIDE 400 MG/5ML PO SUSP: 30.0000 mL | ORAL | Status: DC | PRN

## 2019-01-19 MED ORDER — GABAPENTIN 600 MG PO TABS
900.0000 mg | ORAL_TABLET | Freq: Once | ORAL | Status: AC
  Administered 2019-01-19: 900 mg via ORAL
  Filled 2019-01-19: qty 1.5

## 2019-01-19 MED ORDER — SCOPOLAMINE 1 MG/3DAYS TD PT72
MEDICATED_PATCH | TRANSDERMAL | Status: DC | PRN
  Administered 2019-01-19: 1 via TRANSDERMAL

## 2019-01-19 MED ORDER — NALBUPHINE HCL 10 MG/ML IJ SOLN: 5.0000 mg | Freq: Once | INTRAMUSCULAR | Status: AC | PRN

## 2019-01-19 MED ORDER — SODIUM CHLORIDE 0.9 % IV SOLN
INTRAVENOUS | Status: AC
  Filled 2019-01-19: qty 2

## 2019-01-19 MED ORDER — FERROUS SULFATE 325 (65 FE) MG PO TABS
325.0000 mg | ORAL_TABLET | Freq: Two times a day (BID) | ORAL | Status: DC
  Administered 2019-01-19 – 2019-01-21 (×4): 325 mg via ORAL
  Filled 2019-01-19 (×4): qty 1

## 2019-01-19 MED ORDER — OXYTOCIN 10 UNIT/ML IJ SOLN
INTRAVENOUS | Status: DC | PRN
  Administered 2019-01-19: 40 [IU] via INTRAVENOUS

## 2019-01-19 MED ORDER — PRENATAL MULTIVITAMIN CH
1.0000 | ORAL_TABLET | Freq: Every day | ORAL | Status: DC
  Administered 2019-01-20 – 2019-01-21 (×2): 1 via ORAL
  Filled 2019-01-19 (×2): qty 1

## 2019-01-19 SURGICAL SUPPLY — 30 items
CHLORAPREP W/TINT 26ML (MISCELLANEOUS) ×3 IMPLANT
CLAMP CORD UMBIL (MISCELLANEOUS) IMPLANT
CLOTH BEACON ORANGE TIMEOUT ST (SAFETY) ×3 IMPLANT
DRSG OPSITE POSTOP 4X10 (GAUZE/BANDAGES/DRESSINGS) ×3 IMPLANT
ELECT REM PT RETURN 9FT ADLT (ELECTROSURGICAL) ×3
ELECTRODE REM PT RTRN 9FT ADLT (ELECTROSURGICAL) ×1 IMPLANT
EXTRACTOR VACUUM M CUP 4 TUBE (SUCTIONS) IMPLANT
EXTRACTOR VACUUM M CUP 4' TUBE (SUCTIONS)
GLOVE BIOGEL PI IND STRL 7.0 (GLOVE) ×3 IMPLANT
GLOVE BIOGEL PI INDICATOR 7.0 (GLOVE) ×6
GLOVE ECLIPSE 7.0 STRL STRAW (GLOVE) ×3 IMPLANT
GOWN STRL REUS W/TWL LRG LVL3 (GOWN DISPOSABLE) ×6 IMPLANT
KIT ABG SYR 3ML LUER SLIP (SYRINGE) IMPLANT
NEEDLE HYPO 22GX1.5 SAFETY (NEEDLE) ×3 IMPLANT
NEEDLE HYPO 25X5/8 SAFETYGLIDE (NEEDLE) ×3 IMPLANT
NS IRRIG 1000ML POUR BTL (IV SOLUTION) ×3 IMPLANT
PACK C SECTION WH (CUSTOM PROCEDURE TRAY) ×3 IMPLANT
PAD ABD 7.5X8 STRL (GAUZE/BANDAGES/DRESSINGS) ×6 IMPLANT
PAD OB MATERNITY 4.3X12.25 (PERSONAL CARE ITEMS) ×3 IMPLANT
PENCIL SMOKE EVAC W/HOLSTER (ELECTROSURGICAL) ×3 IMPLANT
RTRCTR C-SECT PINK 25CM LRG (MISCELLANEOUS) IMPLANT
SUT PDS AB 0 CTX 36 PDP370T (SUTURE) IMPLANT
SUT PLAIN 2 0 XLH (SUTURE) IMPLANT
SUT VIC AB 0 CTX 36 (SUTURE) ×4
SUT VIC AB 0 CTX36XBRD ANBCTRL (SUTURE) ×2 IMPLANT
SUT VIC AB 4-0 KS 27 (SUTURE) ×3 IMPLANT
SYR CONTROL 10ML LL (SYRINGE) ×3 IMPLANT
TOWEL OR 17X24 6PK STRL BLUE (TOWEL DISPOSABLE) ×3 IMPLANT
TRAY FOLEY W/BAG SLVR 14FR LF (SET/KITS/TRAYS/PACK) ×3 IMPLANT
WATER STERILE IRR 1000ML POUR (IV SOLUTION) ×3 IMPLANT

## 2019-01-19 NOTE — Anesthesia Postprocedure Evaluation (Signed)
Anesthesia Post Note  Patient: Carrie Alvarado  Procedure(s) Performed: CESAREAN SECTION WITH BILATERAL TUBAL LIGATION (Bilateral )     Patient location during evaluation: Mother Baby Anesthesia Type: Spinal Level of consciousness: awake and alert Pain management: pain level controlled Vital Signs Assessment: post-procedure vital signs reviewed and stable Respiratory status: spontaneous breathing, nonlabored ventilation and respiratory function stable Cardiovascular status: stable Postop Assessment: no headache, no backache, no apparent nausea or vomiting, patient able to bend at knees, able to ambulate, adequate PO intake and spinal receding Anesthetic complications: no    Last Vitals:  Vitals:   01/19/19 1540 01/19/19 1630  BP: (!) 99/52 (!) 97/52  Pulse: (!) 59 60  Resp: 16 16  Temp: (!) 36.4 C   SpO2:  99%    Last Pain:  Vitals:   01/19/19 1540  TempSrc: Axillary   Pain Goal:                   Izayah Miner Hristova

## 2019-01-19 NOTE — Anesthesia Preprocedure Evaluation (Signed)
Anesthesia Evaluation  Patient identified by MRN, date of birth, ID band Patient awake    Reviewed: Allergy & Precautions, H&P , NPO status , Patient's Chart, lab work & pertinent test results  Airway Mallampati: I  TM Distance: >3 FB Neck ROM: full    Dental no notable dental hx. (+) Teeth Intact   Pulmonary neg pulmonary ROS, former smoker,    Pulmonary exam normal breath sounds clear to auscultation       Cardiovascular Exercise Tolerance: Good Normal cardiovascular exam Rhythm:regular Rate:Normal     Neuro/Psych negative psych ROS   GI/Hepatic negative GI ROS, Neg liver ROS,   Endo/Other  negative endocrine ROS  Renal/GU negative Renal ROS  negative genitourinary   Musculoskeletal negative musculoskeletal ROS (+)   Abdominal Normal abdominal exam  (+)   Peds  Hematology  (+) Blood dyscrasia, anemia ,   Anesthesia Other Findings   Reproductive/Obstetrics (+) Pregnancy                             Anesthesia Physical  Anesthesia Plan  ASA: II  Anesthesia Plan: Spinal   Post-op Pain Management:    Induction:   PONV Risk Score and Plan: 3 and Ondansetron, Dexamethasone and Scopolamine patch - Pre-op  Airway Management Planned: Natural Airway and Nasal Cannula  Additional Equipment:   Intra-op Plan:   Post-operative Plan:   Informed Consent: I have reviewed the patients History and Physical, chart, labs and discussed the procedure including the risks, benefits and alternatives for the proposed anesthesia with the patient or authorized representative who has indicated his/her understanding and acceptance.       Plan Discussed with: CRNA  Anesthesia Plan Comments: ( )        Anesthesia Quick Evaluation

## 2019-01-19 NOTE — Transfer of Care (Signed)
Immediate Anesthesia Transfer of Care Note  Patient: Carrie Alvarado  Procedure(s) Performed: CESAREAN SECTION WITH BILATERAL TUBAL LIGATION (Bilateral )  Patient Location: PACU  Anesthesia Type:Spinal  Level of Consciousness: awake, alert  and oriented  Airway & Oxygen Therapy: Patient Spontanous Breathing  Post-op Assessment: Report given to RN and Post -op Vital signs reviewed and stable  Post vital signs: Reviewed and stable  Last Vitals:  Vitals Value Taken Time  BP 124/70 01/19/2019 12:30 PM  Temp    Pulse 99 01/19/2019 12:32 PM  Resp 16 01/19/2019 12:32 PM  SpO2 100 % 01/19/2019 12:32 PM  Vitals shown include unvalidated device data.  Last Pain:  Vitals:   01/19/19 1007  TempSrc: Oral         Complications: No apparent anesthesia complications

## 2019-01-19 NOTE — H&P (Addendum)
Obstetric Preoperative History and Physical  Carrie Alvarado is a 28 y.o. G3P1101 with IUP at [redacted]w[redacted]d presenting for scheduled cesarean section and bilateral tubal sterilization.  Reports good fetal movement, no bleeding, no contractions, no leaking of fluid.  No acute preoperative concerns.    Cesarean Section Indication: Previous Cesarean Section x 2  Prenatal Course Source of Care: [facility]  Pregnancy complications or risks: Patient Active Problem List   Diagnosis Date Noted  . Group B Streptococcus carrier, antepartum 01/09/2019  . History of IUFD 12/21/2018  . 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 Anemia affecting pregnancy 08/18/2018  . Supervision of high risk pregnancy, antepartum 08/12/2018  . History of preterm delivery, currently pregnant 08/12/2018  . History of cesarean delivery affecting pregnancy 03/16/2012    Nursing Staff Provider  Office Location  [facility] Dating   6 wk ultrasound  Language   English Anatomy US   Normal 19 wks  Flu Vaccine   08/12/2018 Genetic Screen   AFP: Neg (declined additional screening per genetic counseling note)  TDaP vaccine   10/26/18 GTT Third trimester:  [id number]  Rhogam   n/a   LAB RESULTS   Feeding Plan  Breast Blood Type O/Positive/-- (10/04 1033)   Contraception  BTL Antibody Negative (10/04 1033)  Circumcision  NA Rubella 8.59 (10/04 1033)  Pediatrician   [facility] RPR Non Reactive (12/18 0914)   Support Person  Valerie Roys - mother HBsAg Negative (10/04 1033)   Prenatal Classes  Yes HIV Non Reactive (12/18 0914)  BTL Consent 11/18/2018 GBS Pos  VBAC Consent N/A Pap Negative; CT & GC neg x 2    Hgb Electro  Normal    CF Negative for the 97 mutations analyzed    SMA SMN1 copy number: 2 (Reduced Carrier Risk); Fragile X  Not a carrier   Past Medical History:  Diagnosis Date  . Medical history non-contributory     Past Surgical History:  Procedure Laterality Date  . BREAST SURGERY     Implants   . CESAREAN SECTION    . CESAREAN SECTION  03/13/2012   Procedure: CESAREAN SECTION;  Surgeon: Lavina Hamman, MD;  Location: [facility];  Service: Gynecology;  Laterality: N/A;  Repeat cesarean section with delivery of baby girl at 26.  Apgars 9/9.  Marland Kitchen OTHER SURGICAL HISTORY     Glass removed in arm    OB History  Gravida Para Term Preterm AB Living  3 2 1 1  0 1  SAB TAB Ectopic Multiple Live Births  0 0 0 0 1    # Outcome Date GA Lbr Len/2nd Weight Sex Delivery Anes PTL Lv  3 Current           2 Term 03/13/12 [redacted]w[redacted]d  2807 g F CS-LTranv Spinal N LIV     Birth Comments: NONE  1 Preterm 04/13/11 [redacted]w[redacted]d   F CS-LTranv  Y FD     Birth Comments: fetal hydrops & chest mass, C/S done at [facility] Pt states she thinks she was about 7 months pregnant    Social History   Socioeconomic History  . Marital status: Married    Spouse name: Not on file  . Number of children: Not on file  . Years of education: Not on file  . Highest education level: Not on file  Occupational History  . Not on file  Social Needs  . Financial resource strain: Not hard at all  . Food insecurity:    Worry: Never  true    Inability: Never true  . Transportation needs:    Medical: No    Non-medical: Not on file  Tobacco Use  . Smoking status: Former Smoker    Last attempt to quit: 05/25/2018    Years since quitting: 0.6  . Smokeless tobacco: Never Used  Substance and Sexual Activity  . Alcohol use: No    Comment: not with pregnancy  . Drug use: No    Types: Marijuana    Comment: not with pregnancy  . Sexual activity: Yes    Birth control/protection: None  Lifestyle  . Physical activity:    Days per week: Not on file    Minutes per session: Not on file  . Stress: Only a little  Relationships  . Social connections:    Talks on phone: Not on file    Gets together: Not on file    Attends religious service: Not on file    Active member of club or organization: Not on file    Attends meetings of clubs or  organizations: Not on file    Relationship status: Not on file  Other Topics Concern  . Not on file  Social History Narrative  . Not on file    Family History  Problem Relation Age of Onset  . Hypotension Neg Hx   . Anesthesia problems Neg Hx     Medications Prior to Admission  Medication Sig Dispense Refill Last Dose  . ferrous sulfate (FERROUSUL) 325 (65 FE) MG tablet Take 1 tablet (325 mg total) by mouth 2 (two) times daily. (Patient taking differently: Take 325 mg by mouth daily at 2 PM. ) 60 tablet 1 01/18/2019 at Unknown time  . Prenatal Multivit-Min-Fe-FA (PRENATAL VITAMINS PO) Take 1 tablet by mouth daily.    01/18/2019 at Unknown time  . terconazole (TERAZOL 7) 0.4 % vaginal cream Place 1 applicator vaginally at bedtime. (Patient taking differently: Place 1 applicator vaginally 2 (two) times a week. ) 45 g 0 Taking    No Known Allergies  Review of Systems: Pertinent items noted in HPI and remainder of comprehensive ROS otherwise negative.  Physical Exam: Blood pressure 113/69, pulse 77, temperature 97.9 F (36.6 C), temperature source Oral, resp. rate 16, height 5' (1.524 m), weight 68.9 kg, last menstrual period 04/10/2018, SpO2 100 %. FHR by Doppler: 130 bpm CONSTITUTIONAL: Well-developed, well-nourished female in no acute distress.  HENT:  Normocephalic, atraumatic, External right and left ear normal. Oropharynx is clear and moist EYES: Conjunctivae and EOM are normal. Pupils are equal, round, and reactive to light. No scleral icterus.  NECK: Normal range of motion, supple, no masses SKIN: Skin is warm and dry. No rash noted. Not diaphoretic. No erythema. No pallor. NEUROLGIC: Alert and oriented to person, place, and time. Normal reflexes, muscle tone coordination. No cranial nerve deficit noted. PSYCHIATRIC: Normal mood and affect. Normal behavior. Normal judgment and thought content. CARDIOVASCULAR: Normal heart rate noted, regular rhythm RESPIRATORY: Effort and  breath sounds normal, no problems with respiration noted ABDOMEN: Soft, nontender, nondistended, gravid. Well-healed Pfannenstiel incision. PELVIC: Deferred MUSCULOSKELETAL: Normal range of motion. No edema and no tenderness. 2+ distal pulses.   Pertinent Labs/Studies:   Results for orders placed or performed during the hospital encounter of 01/18/19 (from the past 72 hour(s))  Type and screen     Status: None   Collection Time: 01/18/19 10:20 AM  Result Value Ref Range   ABO/RH(D) O POS    Antibody Screen NEG  Sample Expiration      01/21/2019 Performed at [facility], [address] [place], [place] [address]   ABO/Rh     Status: None   Collection Time: 01/18/19 10:20 AM  Result Value Ref Range   ABO/RH(D)      O POS Performed at [facility], [address], [place], [place] [address]   CBC     Status: Abnormal   Collection Time: 01/18/19 10:30 AM  Result Value Ref Range   WBC 9.9 4.0 - 10.5 K/uL   RBC 4.35 3.87 - 5.11 MIL/uL   Hemoglobin 10.7 (L) 12.0 - 15.0 g/dL   HCT 03.4 (L) 74.2 - 59.5 %   MCV 80.0 80.0 - 100.0 fL   MCH 24.6 (L) 26.0 - 34.0 pg   MCHC 30.7 30.0 - 36.0 g/dL   RDW 63.8 (H) 75.6 - 43.3 %   Platelets 290 150 - 400 K/uL   nRBC 0.4 (H) 0.0 - 0.2 %    Comment: Performed at [facility], [address] [place], [place] [address]  RPR     Status: None   Collection Time: 01/18/19 10:30 AM  Result Value Ref Range   RPR Ser Ql Non Reactive Non Reactive    Comment: (NOTE) Performed At: [facility] [address] [place], [place] [id number] Jolene Schimke MD ZS:[id number]     Assessment and Plan:Ani Rappe is a 28 y.o. G3P1101 at [redacted]w[redacted]d being admitted for scheduled repeat cesarean section and bilateral tubal sterilization. The risks of cesarean section were discussed with the patient including but were not limited to: bleeding which may require transfusion or reoperation; infection which may require antibiotics; injury to  bowel, bladder, ureters or other surrounding organs; injury to the fetus; need for additional procedures including hysterectomy in the event of a life-threatening hemorrhage; placental abnormalities wth subsequent pregnancies, incisional problems, thromboembolic phenomenon and other postoperative/anesthesia complications.  Patient also desires permanent sterilization.  Other reversible forms of contraception were discussed with patient; she declines all other modalities. Emphasized permanence of method, increased risk of regret given age < 16.  Risks of procedure discussed with patient including but not limited to: risk of regret, permanence of method, bleeding, infection, injury to surrounding organs and need for additional procedures.  Failure risk of about 1% with increased risk of ectopic gestation if pregnancy occurs was also discussed with patient.  The patient concurred with the proposed plan, giving informed written consent for the procedures.  Patient has been NPO since last night she will remain NPO for procedure. Anesthesia and OR aware.  Preoperative prophylactic antibiotics and SCDs ordered on call to the OR.  To OR when ready.   Jaynie Collins, MD, FACOG Obstetrician & Gynecologist, [facility] for [employer], [facility]

## 2019-01-19 NOTE — Lactation Note (Signed)
This note was copied from a baby's chart. Lactation Consultation Note  Patient Name: Carrie Alvarado [id number] Date: 01/19/2019 Reason for consult: Initial assessment;Term  10 hours old FT female who is being exclusively BF by her mother, she's a P3 but not very experienced BF. She BF her second child only for 2 days and quit due to a difficult latch. Her first baby was never BF because baby passed away. Mom participated in the [facility] program at the [facility] but she's not familiar with hand expression. LC revised hand expression with mom and she was able to get colostrum very easily, it was pouring off her nipples. Mom voiced it's because she had piercings in both of her nipples, but she removed them both. She plans to put them back on once she's done BF though.  Offered assistance with latch and mom agreed to wake baby up to feed, she started to cue. LC took baby STS to mom's right breast and she was able to latch after a few tries, she kept pushing away from the breast, but when she finally latched, she stayed on with a few audible swallows heard. Mom was very pleased and she told LC she couldn't believe this baby is actually feeding at the breast, praised her for her efforts. Discussed normal newborn behavior, cluster feeding and feeding cues. Mom mentioned to [facility] that she felt her nipples were dried. Asked RN Malachi Bonds to get her some coconut oil (in addition to her colostrum) for breast care.  Feeding plan:  1. Encouraged mom to feed baby STS 8-12 times/24 hours or sooner if feeding cues are present 2. Hand expression and spoon feeding were also encouraged 3. Mom will use her own colostrum to prevent sore/dry nipples and also coconut oil for lubrication  BF brochure, BF resources and feeding diary were reviewed. Parents reported all questions and concerns were answered, they're both aware of LC services and will call PRN.  Maternal Data Formula Feeding for Exclusion: No Has patient been  taught Hand Expression?: Yes Does the patient have breastfeeding experience prior to this delivery?: Yes  Feeding Feeding Type: Breast Fed  LATCH Score Latch: Repeated attempts needed to sustain latch, nipple held in mouth throughout feeding, stimulation needed to elicit sucking reflex.  Audible Swallowing: A few with stimulation(with breast compressions)  Type of Nipple: Everted at rest and after stimulation  Comfort (Breast/Nipple): Soft / non-tender  Hold (Positioning): Assistance needed to correctly position infant at breast and maintain latch.  LATCH Score: 7  Interventions Interventions: Breast feeding basics reviewed;Assisted with latch;Skin to skin;Breast massage;Hand express;Breast compression;Adjust position;Support pillows;Coconut oil  Lactation Tools Discussed/Used Tools: Coconut oil WIC Program: Yes   Consult Status Consult Status: Follow-up Date: 01/20/19 Follow-up type: In-patient    Tylen Leverich Venetia Constable 01/19/2019, 11:03 PM

## 2019-01-19 NOTE — Addendum Note (Signed)
Addendum  created 01/19/19 1714 by Elgie Congo, CRNA   Clinical Note Signed

## 2019-01-19 NOTE — Anesthesia Postprocedure Evaluation (Signed)
Anesthesia Post Note  Patient: Carrie Alvarado  Procedure(s) Performed: CESAREAN SECTION WITH BILATERAL TUBAL LIGATION (Bilateral )     Patient location during evaluation: PACU Anesthesia Type: Spinal Level of consciousness: awake Pain management: pain level controlled Vital Signs Assessment: post-procedure vital signs reviewed and stable Respiratory status: spontaneous breathing Cardiovascular status: stable Postop Assessment: no headache, no backache, spinal receding, patient able to bend at knees and no apparent nausea or vomiting Anesthetic complications: no    Last Vitals:  Vitals:   01/19/19 1330 01/19/19 1345  BP: (!) 105/57 108/62  Pulse: 69 62  Resp: (!) 22 18  Temp:    SpO2: 100% 99%    Last Pain:  Vitals:   01/19/19 1230  TempSrc: Oral   Pain Goal:                Epidural/Spinal Function Cutaneous sensation: Tingles (01/19/19 1315), Patient able to flex knees: Yes (01/19/19 1315), Patient able to lift hips off bed: No (01/19/19 1315), Back pain beyond tenderness at insertion site: No (01/19/19 1315), Progressively worsening motor and/or sensory loss: No (01/19/19 1315), Bowel and/or bladder incontinence post epidural: No (01/19/19 1315)  Caren Macadam

## 2019-01-19 NOTE — Op Note (Addendum)
Carrie Alvarado PROCEDURE DATE: 01/19/2019  PREOPERATIVE DIAGNOSES: Intrauterine pregnancy at [redacted]w[redacted]d weeks gestation; previous uterine incision LTCS x 2; undesired fertility  POSTOPERATIVE DIAGNOSES: The same  PROCEDURE: Repeat Low Transverse Cesarean Section, Bilateral Tubal Sterilization using Filshie clips  SURGEON:  Dr. Jaynie Collins  ASSISTANT:  Dr. Rhett Bannister  ANESTHESIOLOGIST: Dr. Leilani Able  INDICATIONS: Carrie Alvarado is a 28 y.o. G3P1101 at [redacted]w[redacted]d here for cesarean section and bilateral tubal sterilization secondary to the indications listed under preoperative diagnoses; please see preoperative note for further details.  The risks of surgery were discussed with the patient including but were not limited to: bleeding which may require transfusion or reoperation; infection which may require antibiotics; injury to bowel, bladder, ureters or other surrounding organs; injury to the fetus; need for additional procedures including hysterectomy in the event of a life-threatening hemorrhage; placental abnormalities wth subsequent pregnancies, incisional problems, thromboembolic phenomenon and other postoperative/anesthesia complications.  Patient also desires permanent sterilization.  Other reversible forms of contraception were discussed with patient; she declines all other modalities. Risks of procedure discussed with patient including but not limited to: risk of regret, permanence of method, bleeding, infection, injury to surrounding organs and need for additional procedures.  Failure risk of 1% with increased risk of ectopic gestation if pregnancy occurs was also discussed with patient.  The patient concurred with the proposed plan, giving informed written consent for the procedures.    FINDINGS:  Viable female infant in cephalic presentation.  Apgars 8 and 9.  Clear amniotic fluid.  Intact placenta, three vessel cord.  Normal uterus, fallopian tubes and ovaries bilaterally. Fallopian tubes  were sterilized bilaterally. Some minimal intraperitoneal adhesions, easily lysed using blunt methods. One thicker adhesion from anterior uterus to anterior abdominal wall was lysed with electrocautery.  ANESTHESIA: Spinal INTRAVENOUS FLUIDS: 2600 ml ESTIMATED BLOOD LOSS: 458 ml per Triton URINE OUTPUT:  300 ml SPECIMENS: Placenta sent to L&D COMPLICATIONS: None immediate  PROCEDURE IN DETAIL:  The patient preoperatively received intravenous antibiotics and had sequential compression devices applied to her lower extremities.   She was then taken to the operating room where spinal anesthesia was administered and was found to be adequate. She was then placed in a dorsal supine position with a leftward tilt, and prepped and draped in a sterile manner.  A foley catheter was placed into her bladder and attached to constant gravity.  After an adequate timeout was performed, a Pfannenstiel skin incision was made with scalpel over her preexisting scar and carried through to the underlying layer of fascia. The fascia was incised in the midline, and this incision was extended bilaterally using the Mayo scissors.  Kocher clamps were applied to the superior aspect of the fascial incision and the underlying rectus muscles were dissected off bluntly.  A similar process was carried out on the inferior aspect of the fascial incision. The rectus muscles were separated in the midline and the peritoneum was entered bluntly. Some adhesions were encountered and lysed as described above. The Alexis self-retaining retractor was introduced into the abdominal cavity.  Attention was turned to the lower uterine segment where a low transverse hysterotomy was made with a scalpel and extended bilaterally bluntly.  The infant was successfully delivered, the cord was clamped and cut after one minute, and the infant was handed over to the awaiting neonatology team. Uterine massage was then administered, and the placenta delivered intact  with a three-vessel cord. The uterus was then cleared of clots and debris.  The hysterotomy was  closed with 0 Vicryl in a running locked fashion, and an imbricating layer was also placed with 0 Vicryl. Figure-of-eight 0 Vicryl serosal stitches were placed to help with hemostasis.  Attention was then turned to the fallopian tubes, and Filshie clips were placed about 3 cm from the cornua of both fallopian tubes, with care given to incorporate the underlying mesosalpinx on both sides, allowing for bilateral tubal sterilization.   Excellent hemostasis was noted. The pelvis was cleared of all clot and debris. Hemostasis was confirmed on all surfaces.  The retractor was removed.  The peritoneum was closed with a 0 Vicryl running stitch. The fascia was then closed using 0 PDS in a running fashion.  The subcutaneous layer was irrigated, and the skin was closed with a 4-0 Vicryl subcuticular stitch. The patient tolerated the procedure well. Sponge, instrument and needle counts were correct x 3.  She was taken to the recovery room in stable condition.    Jaynie Collins, MD, FACOG Obstetrician & Gynecologist, [facility] for [employer], [facility]

## 2019-01-19 NOTE — Anesthesia Procedure Notes (Signed)
Spinal  Patient location during procedure: OR Start time: 01/19/2019 11:08 AM End time: 01/19/2019 11:10 AM Staffing Anesthesiologist: Leilani Able, MD Performed: anesthesiologist  Preanesthetic Checklist Completed: patient identified, site marked, surgical consent, pre-op evaluation, timeout performed, IV checked, risks and benefits discussed and monitors and equipment checked Spinal Block Patient position: sitting Prep: site prepped and draped and DuraPrep Patient monitoring: continuous pulse ox and blood pressure Approach: midline Location: L3-4 Injection technique: single-shot Needle Needle type: Pencan  Needle gauge: 24 G Needle length: 10 cm Needle insertion depth: 6 cm Assessment Sensory level: T4 Events: paresthesia Additional Notes R X 2

## 2019-01-20 ENCOUNTER — Encounter (HOSPITAL_COMMUNITY): Payer: Self-pay

## 2019-01-20 ENCOUNTER — Other Ambulatory Visit: Payer: Self-pay

## 2019-01-20 LAB — CBC
HCT: 30.7 % — ABNORMAL LOW (ref 36.0–46.0)
Hemoglobin: 9.3 g/dL — ABNORMAL LOW (ref 12.0–15.0)
MCH: 24.2 pg — ABNORMAL LOW (ref 26.0–34.0)
MCHC: 30.3 g/dL (ref 30.0–36.0)
MCV: 79.9 fL — ABNORMAL LOW (ref 80.0–100.0)
Platelets: 281 10*3/uL (ref 150–400)
RBC: 3.84 MIL/uL — ABNORMAL LOW (ref 3.87–5.11)
RDW: 16.7 % — ABNORMAL HIGH (ref 11.5–15.5)
WBC: 13 10*3/uL — ABNORMAL HIGH (ref 4.0–10.5)
nRBC: 0 % (ref 0.0–0.2)

## 2019-01-20 LAB — BIRTH TISSUE RECOVERY COLLECTION (PLACENTA DONATION)

## 2019-01-20 LAB — CREATININE, SERUM
Creatinine, Ser: 0.42 mg/dL — ABNORMAL LOW (ref 0.44–1.00)
GFR calc Af Amer: >60 mL/min (ref >60)
GFR calc non Af Amer: >60 mL/min (ref >60)

## 2019-01-20 NOTE — Progress Notes (Signed)
CSW acknowledged consult and attempted to meet with MOB. However, MOB asleep. CSW will attempt to meet with MOB at a later time.  Archie Balboa, LCSWA  [facility] [phone]

## 2019-01-20 NOTE — Progress Notes (Signed)
Subjective: Postpartum Day 1: Cesarean Delivery with BTL  Patient reports incisional pain, tolerating PO, + flatus and no problems voiding.  No HA, RUQ pain, or Visual disturbances.   Objective: Vital signs in last 24 hours: Temp:  [97.5 F (36.4 C)-98.2 F (36.8 C)] 98 F (36.7 C) (03/13 0615) Pulse Rate:  [53-94] 77 (03/13 0615) Resp:  [16-26] 16 (03/13 0615) BP: (96-124)/(52-84) 108/63 (03/13 0615) SpO2:  [98 %-100 %] 100 % (03/13 0615) Weight:  [68.9 kg] 68.9 kg (03/12 1007)  Physical Exam:  General: alert, cooperative and no distress Lochia: appropriate Uterine Fundus: firm Incision: healing well, no significant drainage, no dehiscence, no significant erythema, slight clear drainage present DVT Evaluation: No evidence of DVT seen on physical exam. No cords or calf tenderness. No significant calf/ankle edema.  Recent Labs    01/18/19 1030 01/20/19 0621  HGB 10.7* 9.3*  HCT 34.8* 30.7*    Assessment/Plan: Status post Cesarean section. Doing well postoperatively.  Continue current care.  Sandi Raveling MD  01/20/2019, 9:28 AM

## 2019-01-20 NOTE — Progress Notes (Addendum)
CSW received consult due to score 12 on Edinburgh Depression Screen.    MOB eating food on the couch with baby asleep in basinet when CSW entered the room. CSW introduced self and reason for consult. MOB inviting and open to CSW completing assessment. CSW inquired about MOB's Edinburgh score to which MOB stated she had those feelings at the beginning of her pregnancy but not recently. MOB reported she didn't find out she was pregnant until she was 4 months along and felt bad because she wasn't taking care of herself. MOB denied any past mental health history but did report participating in therapy after the loss of her first child in 2013. MOB denied any current mental health symptoms and appeared to be in good spirits. MOB denied any current SI/HI or DV in the home.   CSW provided education regarding Baby Blues vs PMADs and provided MOB with resources for mental health follow up.  CSW encouraged MOB to evaluate her mental health throughout the postpartum period with the use of the New Mom Checklist developed by Postpartum Progress as well as the [place] Postnatal Depression Scale and notify a medical professional if symptoms arise.    MOB denied any further questions or concerns for CSW at this time. Please reconsult if needs arise.  Archie Balboa, LCSWA  [facility] [phone]

## 2019-01-20 NOTE — Lactation Note (Addendum)
This note was copied from a baby's chart. Lactation Consultation Note  Patient Name: Carrie Alvarado Date: 01/20/2019  Mom in bathroom on arrival/infant just had bath and cuing and RN just changed her first large poop. Infant now 73 hours old.  Assist with breastfeeding in laid back breastfeeding position. Mom with history of breast implants. No visible scars. Mom reports had surgery underneath breasts in 2018.  Mom reported wanted the surgery because she felt her breasts hung down.Mom reports she first thought she could not breastfeed with implants. Mom reports sometimes she gets fussy at the breast and doesn't latch well.  Mom with normal breasts and nipples/evert well with stimulation.  Mom reports nipples are sore but left nipple intact. Showed mom how to hand express prior to breastfeeding to have milk there for her. Milk easily expressed.  Infant latched  Easily to left breast and breastfed well.  Reviewed cluster feeding.  Urged mom to do some hand expression prior to latching and hand expression and  spoon feeding dessert after breastfeedings. Left mom and baby breastfeeding.  Urged mom to call lactation as needed.  Maternal Data    Feeding Feeding Type: Breast Fed  [facility] Score                   Interventions    Lactation Tools Discussed/Used     Consult Status      Carrie Alvarado 01/20/2019, 12:51 PM

## 2019-01-21 ENCOUNTER — Encounter (HOSPITAL_COMMUNITY): Payer: Self-pay | Admitting: *Deleted

## 2019-01-21 MED ORDER — SENNOSIDES-DOCUSATE SODIUM 8.6-50 MG PO TABS: 2.0000 | ORAL_TABLET | ORAL | 0 refills | Status: DC

## 2019-01-21 MED ORDER — IBUPROFEN 800 MG PO TABS: 800.0000 mg | ORAL_TABLET | Freq: Four times a day (QID) | ORAL | 0 refills | Status: DC

## 2019-01-21 MED ORDER — OXYCODONE-ACETAMINOPHEN 5-325 MG PO TABS: 1.0000 | ORAL_TABLET | ORAL | 0 refills | Status: AC | PRN

## 2019-01-21 NOTE — Discharge Summary (Addendum)
OB Discharge Summary     Patient Name: Carrie Alvarado DOB: [birth year] MRN: [id number] Date of admission: 01/19/2019  Delivering MD: Jaynie Collins A )  Date of discharge: 01/21/2019  Admitting diagnosis: scheduled cesarean section and bilateral tubal sterilization Intrauterine pregnancy: [redacted]w[redacted]d    Secondary diagnosis:  Active Problems: no active preoperative concerns    Patient Active Problem List   Diagnosis Date Noted  . Group B Streptococcus carrier, antepartum 01/09/2019  . History of IUFD 12/21/2018  . 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 Anemia affecting pregnancy 08/18/2018  . Supervision of high risk pregnancy, antepartum 08/12/2018  . History of preterm delivery, currently pregnant 08/12/2018  . S/P cesarean section and BTS 03/16/2012    Additional problems: None      Discharge diagnosis: Term Pregnancy Delivered                                                                                                Post partum procedures:postpartum tubal ligation  Complications: None  Hospital course:  Sceduled C/S   28 y.o. yo G3P1101 at [redacted]w[redacted]d was admitted to the hospital 01/19/2019 for scheduled cesarean section with the following indication:Elective Repeat.  Membrane Rupture Time/Date: 11:42 AM ,01/19/2019   Patient delivered a Viable infant.01/19/2019  Details of operation can be found in separate operative note.  Pateint had an uncomplicated postpartum course.  She is ambulating, tolerating a regular diet, passing flatus, and urinating well. Patient is discharged home in stable condition on  01/21/19         Physical exam  Vitals:   01/20/19 2311 01/21/19 0530  BP: 107/64 114/66  Pulse: 83 65  Resp: 16 16  Temp: 98.5 F (36.9 C) 98 F (36.7 C)  SpO2: 99% 99%    General: alert, cooperative and no distress Lochia: appropriate Uterine Fundus: firm Incision: Healing well with no significant drainage, No significant erythema, Dressing is clean, dry,  and intact DVT Evaluation: No evidence of DVT seen on physical exam.  Labs: No results found for this or any previous visit (from the past 24 hour(s)).   Discharge instruction: per After Visit Summary and "Baby and Me Booklet".  After visit meds:  No Known Allergies  Allergies as of 01/21/2019   No Known Allergies     Medication List    STOP taking these medications   PRENATAL VITAMINS PO   terconazole 0.4 % vaginal cream Commonly known as:  TERAZOL 7     TAKE these medications   ferrous sulfate 325 (65 FE) MG tablet Commonly known as:  FerrouSul Take 1 tablet (325 mg total) by mouth 2 (two) times daily. What changed:  when to take this   ibuprofen 800 MG tablet Commonly known as:  ADVIL,MOTRIN Take 1 tablet (800 mg total) by mouth every 6 (six) hours.   oxyCODONE-acetaminophen 5-325 MG tablet Commonly known as:  PERCOCET/ROXICET Take 1-2 tablets by mouth every 4 (four) hours as needed for up to 5 days for moderate pain.   senna-docusate 8.6-50 MG tablet Commonly known as:  Senokot-S Take 2 tablets by mouth daily.  Diet: routine diet  Activity: Advance as tolerated. Pelvic rest for 6 weeks.   Outpatient follow IV:HSJW with mother Future Appointments:  Future Appointments  Date Time Provider Department Center  02/02/2019 10:30 AM [facility] NURSE [facility] None  02/24/2019 10:30 AM Karim-Rhoades, Kae Heller, CNM [facility] None    Follow up Appt: No follow-ups on file.   Postpartum contraception: Tubal Ligation completed   Newborn Data: APGAR (1 MIN): 8   APGAR (5 MINS): 8    Baby Feeding: Breast Disposition:home with mother  Sandi Raveling, MD  01/21/2019  I confirm that I have verified the information documented in the resident's note and that I have also personally reperformed the history, physical exam and all medical decision making activities of this service and have verified that all service and findings are accurately documented in  this student's note.   Rolm Bookbinder, [place] 01/21/2019 1:05 PM

## 2019-01-21 NOTE — Lactation Note (Signed)
This note was copied from a Carrie's chart. Lactation Consultation Note  Patient Name: Carrie Alvarado [id number] Date: 01/21/2019 Reason for consult: Follow-up assessment;Term;1st time breastfeeding;Breast augmentation  1111 - 1132 - I followed up with Carrie Alvarado to assist with breast feeding and to conduct discharge education. She was changing a diaper upon entry,and Carrie Alvarado was crying. The pediatrician came into the room to assess Carrie. Following assessment, I assisted mom with placing Carrie in cradle hold on her left breast. She preferred to sit up while feeding, and I provided support pillows.  Mom states that her nipples are sore from breast feeding. They appeared in tact and WNL. When I assisted with latching Carrie, I showed mom how to make a "breast sandwich" and wait for Carrie to open wide, bringing to the breast. Rhythmic suckling sequences noted. Carrie appeared relaxed at the breast. Mom states that this technique was helpful and reduced pain. Carrie fed approximately 7 minutes and then released the breast. The nipple appeared round upon release.  I discussed feeding frequency, output expectations, and when to expect mature milk to come in. I discussed signs that Carrie is getting noguchi to eat, and I answered questions about when to introduce formula. Mom plans to offer formula at some point, and I encouraged her to wait until her milk was well established so long as Carrie is gaining well and breast feeding is going well. I shared our community breast feeding resourses for support after discharge.  Mom has a pediatric follow up scheduled for Monday. She does not have a breast pump at home, but she has met with [facility] here at the hospital. I reviewed [facility] resources for obtaining a pump, particularly for when she returns to work.   No further questions or concerns at this time.   Maternal Data Does the patient have breastfeeding experience prior to this delivery?: No  Feeding Feeding Type: Breast  Fed  LATCH Score Latch: Grasps breast easily, tongue down, lips flanged, rhythmical sucking.  Audible Swallowing: Spontaneous and intermittent  Type of Nipple: Everted at rest and after stimulation  Comfort (Breast/Nipple): Filling, red/small blisters or bruises, mild/mod discomfort  Hold (Positioning): Assistance needed to correctly position infant at breast and maintain latch.  LATCH Score: 8  Interventions Interventions: Assisted with latch;Skin to skin;Breast compression;Support pillows;Adjust position  Lactation Tools Discussed/Used WIC Program: Yes   Consult Status Consult Status: Complete Follow-up type: Call as needed    Lenore Manner 01/21/2019, 11:39 AM

## 2019-01-25 ENCOUNTER — Encounter: Payer: Medicaid Other | Admitting: Obstetrics and Gynecology

## 2019-01-26 ENCOUNTER — Telehealth: Payer: Self-pay | Admitting: General Practice

## 2019-01-26 NOTE — Telephone Encounter (Signed)
Pt called wanting to get a refill on percocet. Informed pt that medication will not be refilled and that she will need to speak with provider on 02/02/19 when she comes for her incision check.  Consulted with Rolitta Dawson,CNM and she recommended pt to take 600mg  of Ibuprofen every 6hrs.  Pt verbalized understanding.

## 2019-02-02 ENCOUNTER — Other Ambulatory Visit: Payer: Self-pay

## 2019-02-02 ENCOUNTER — Ambulatory Visit (INDEPENDENT_AMBULATORY_CARE_PROVIDER_SITE_OTHER): Payer: Medicaid Other | Admitting: *Deleted

## 2019-02-02 ENCOUNTER — Encounter: Payer: Self-pay | Admitting: General Practice

## 2019-02-02 VITALS — BP 100/63 | HR 67 | Temp 98.3°F | Wt 132.2 lb

## 2019-02-02 DIAGNOSIS — Z5189 Encounter for other specified aftercare: Secondary | ICD-10-CM

## 2019-02-02 NOTE — Progress Notes (Signed)
   Subjective:     Carrie Alvarado is a 28 y.o. female who presents to the clinic 2 weeks status post low transverse c-section for delivery of infant. Eating a regular diet without difficulty. Bowel movements are normal. The patient is not having any pain.  The following portions of the patient's history were reviewed and updated as appropriate: allergies and problem list.   Review of Systems Pertinent items are noted in HPI.    Objective:    There were no vitals taken for this visit. General:  alert, cooperative, appears stated age and no distress  Abdomen: soft, bowel sounds active, non-tender  Incision:   healing well, no drainage, no erythema, no hernia, no seroma, no swelling, no dehiscence, incision well approximated     Assessment:    Doing well postoperatively.   Plan:    1. Continue any current medications. 2. Wound care discussed. 3. Activity restrictions: no bending, stooping, or squatting, no climbing, no gym class, no lifting more than 25 pounds and no sports 4. Anticipated return to work: 6 weeks. 5. Follow up: 6 weeks for postpartum visit.   Clovis Pu, RN

## 2019-02-13 ENCOUNTER — Telehealth: Payer: Self-pay | Admitting: *Deleted

## 2019-02-13 NOTE — Telephone Encounter (Signed)
Left voice message for patient that her PP visit will be telehealth visit. Clovis Pu, RN

## 2019-02-20 ENCOUNTER — Telehealth: Payer: Self-pay | Admitting: General Practice

## 2019-02-20 NOTE — Telephone Encounter (Signed)
Left message on VM in regards to appt change.  Asked pt to give our office a call with any questions or concerns.

## 2019-02-23 ENCOUNTER — Ambulatory Visit: Payer: Medicaid Other | Admitting: Obstetrics and Gynecology

## 2019-02-23 ENCOUNTER — Telehealth: Payer: Self-pay | Admitting: General Practice

## 2019-02-23 NOTE — Telephone Encounter (Signed)
Attempted to call pt 2x to check in for PP visit.  Pt did not answer and was unable to leave message on VM.

## 2019-02-24 ENCOUNTER — Ambulatory Visit: Payer: Medicaid Other | Admitting: Family

## 2019-03-08 ENCOUNTER — Other Ambulatory Visit: Payer: Self-pay

## 2019-03-08 ENCOUNTER — Ambulatory Visit (INDEPENDENT_AMBULATORY_CARE_PROVIDER_SITE_OTHER): Payer: Medicaid Other | Admitting: Obstetrics and Gynecology

## 2019-03-08 ENCOUNTER — Encounter: Payer: Self-pay | Admitting: Obstetrics and Gynecology

## 2019-03-08 DIAGNOSIS — Z1389 Encounter for screening for other disorder: Secondary | ICD-10-CM

## 2019-03-08 NOTE — Progress Notes (Signed)
WEBEX VIRTUAL POSTPARTUM VISIT ENCOUNTER NOTE  I connected with Carrie Alvarado on 03/08/19 at 10:00 AM EDT by Webex at home and verified that I am speaking with the correct person using two identifiers.   I discussed the limitations, risks, security and privacy concerns of performing an evaluation and management service by telephone and the availability of in person appointments. I also discussed with the patient that there may be a patient responsible charge related to this service. The patient expressed understanding and agreed to proceed.   History:  Carrie Alvarado is a 28 y.o. G42P1101 female who presents for a postpartum visit. She is 5 weeks postpartum following a c-section delivery. I have fully reviewed the prenatal and intrapartum course. The delivery was at 37 gestational weeks.  Anesthesia: spinal. Postpartum course has been uncomplicated. Baby's course has been uncomplicated. Baby is feeding by formula: Carrie Alvarado Start, stopped breastfeeding 2 weeks ago. Bleeding: started 03/05/2019 regular menses; first three days were heavy. Bowel function is normal. Bladder function is normal. Patient is not sexually active. Contraception method choice is BTL. Postpartum depression screening: negative: score 5.        Past Medical History:  Diagnosis Date  . Medical history non-contributory   . Supervision of high risk pregnancy, antepartum 08/12/2018    Nursing Staff Provider Office Location  [facility] Dating   6 wk ultrasound Language   English Anatomy US   Normal 19 wks Flu Vaccine   08/12/2018 Genetic Screen   AFP: Neg (declined additional screening per genetic counseling note) TDaP vaccine   10/26/18 GTT Third trimester:  76-168-127 Rhogam   n/a   LAB RESULTS  Feeding Plan  Breast Blood Type O/Positive/-- (10/04 1033)  Contraception  OCP or   Past Surgical History:  Procedure Laterality Date  . BREAST SURGERY     Implants  . CESAREAN SECTION    . CESAREAN SECTION  03/13/2012   Procedure:  CESAREAN SECTION;  Surgeon: Lavina Hamman, MD;  Location: [facility];  Service: Gynecology;  Laterality: N/A;  Repeat cesarean section with delivery of baby girl at 41.  Apgars 9/9.  Marland Kitchen CESAREAN SECTION WITH BILATERAL TUBAL LIGATION Bilateral 01/19/2019   Procedure: CESAREAN SECTION WITH BILATERAL TUBAL LIGATION;  Surgeon: Tereso Newcomer, MD;  Location: [facility];  Service: Obstetrics;  Laterality: Bilateral;  . OTHER SURGICAL HISTORY     Glass removed in arm   The following portions of the patient's history were reviewed and updated as appropriate: allergies, current medications, past family history, past medical history, past social history, past surgical history and problem list.   Health Maintenance:  Normal pap.  Review of Systems:  Pertinent items noted in HPI and remainder of comprehensive ROS otherwise negative.  Physical Exam:  Physical exam deferred due to nature of the encounter  Assessment and Plan:  Encounter for postpartum visit - Normal PP visit - AEX due 08/2019. Explained that if COVID-19 restrictions are not lifted by then, her AEX will need to be postponed. - Ok to return to work in May when scheduled with manager   I discussed the assessment and treatment plan with the patient. The patient was provided an opportunity to ask questions and all were answered. The patient agreed with the plan and demonstrated an understanding of the instructions.   The patient was advised to call back or seek an in-person evaluation/go to the ED if the symptoms worsen or if the condition fails to improve as anticipated.  I  provided 10 minutes of non-face-to-face time during this encounter. There was 5 minutes of chart review time spent prior to this encounter. Total time spent = 15 minutes.   Raelyn Moraolitta Opaline Reyburn, CNM [facility], [facility]

## 2019-06-25 IMAGING — US US MFM OB FOLLOW-UP
1 series · 14 of 23 positions shown · non-contrast
Comparison: none

[Series 1: us mfm ob follow-up · 23 acquisitions, 14 frames shown]
[im 1/23]
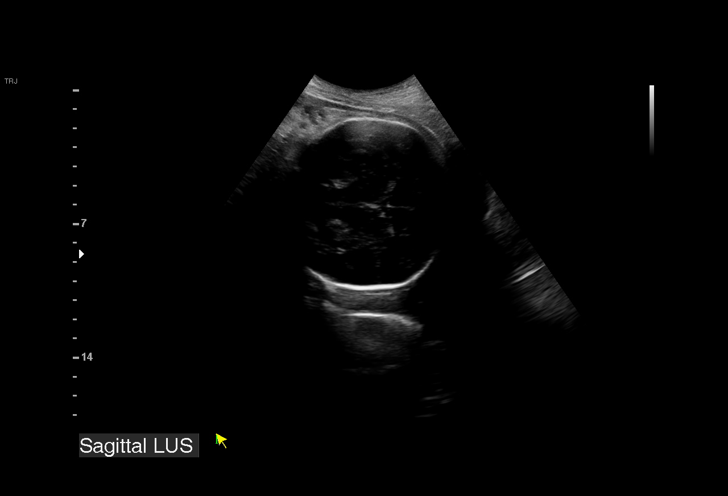
[im 3/23]
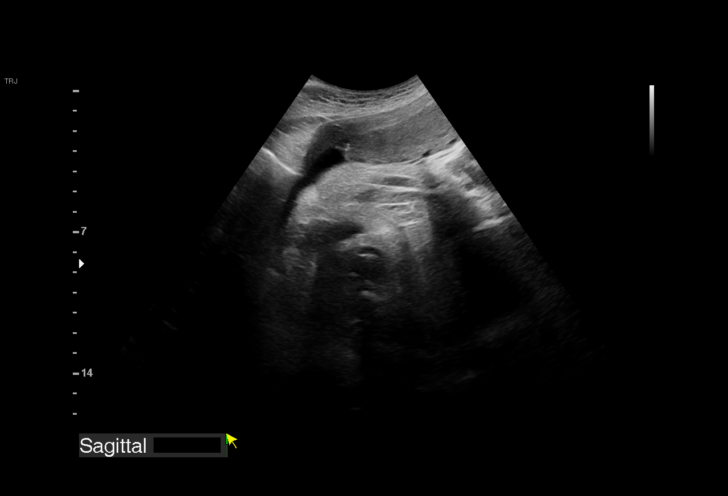
[im 5/23]
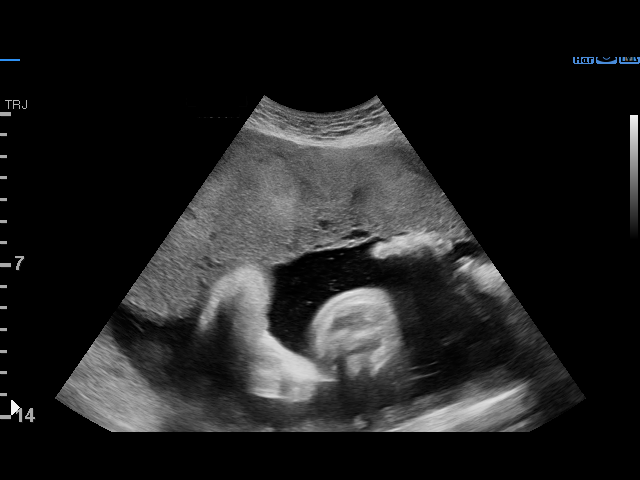
[im 6/23]
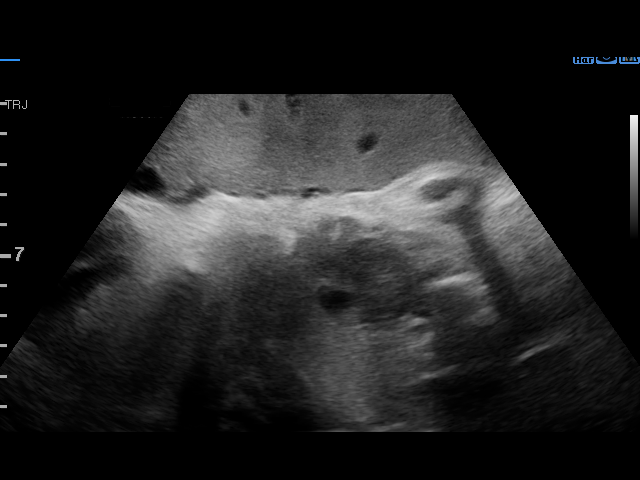
[im 8/23]
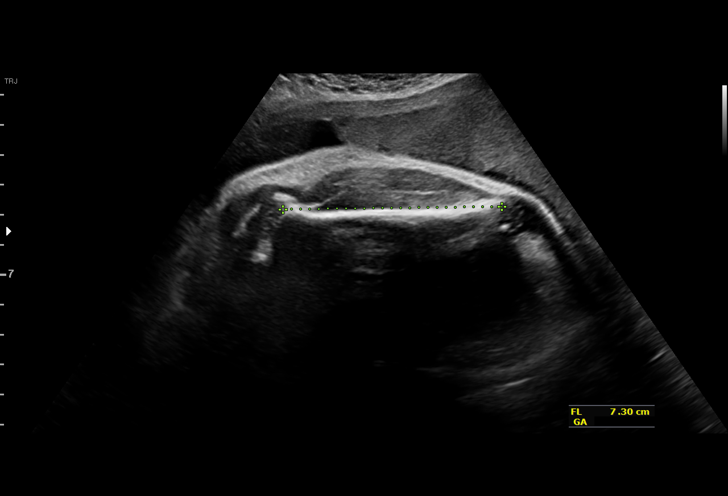
[im 10/23]
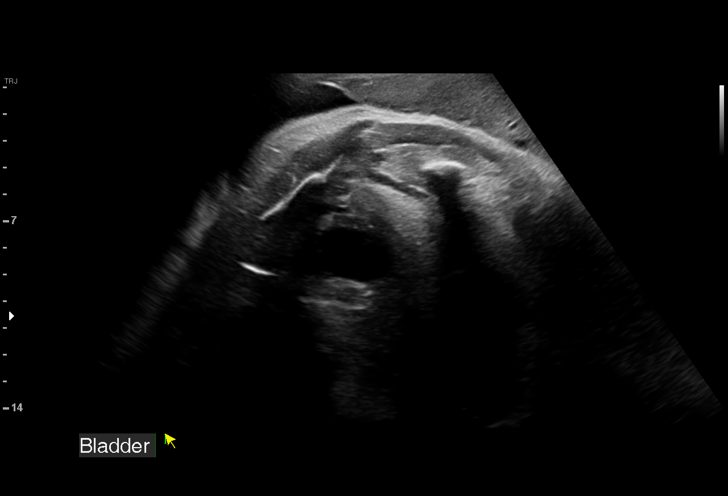
[im 11/23]
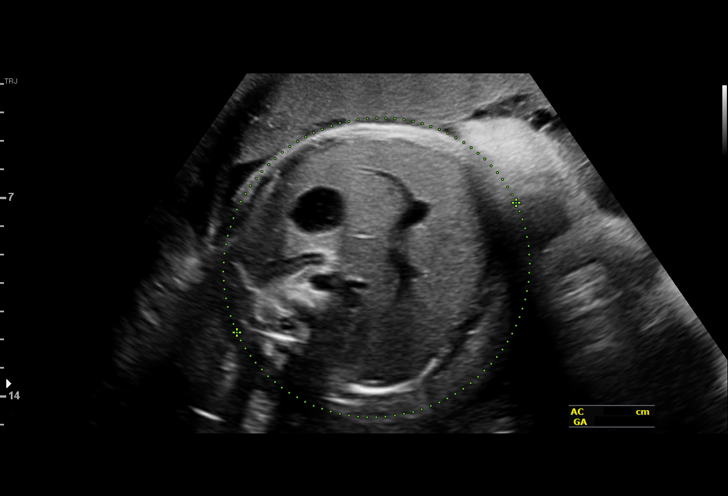
[im 13/23]
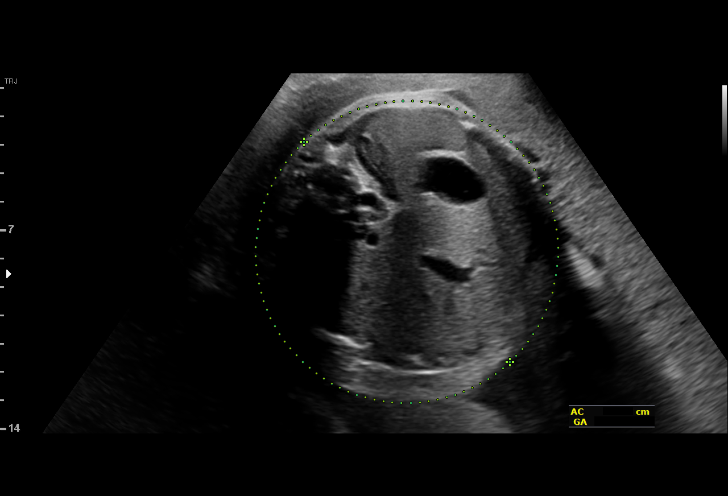
[im 14/23]
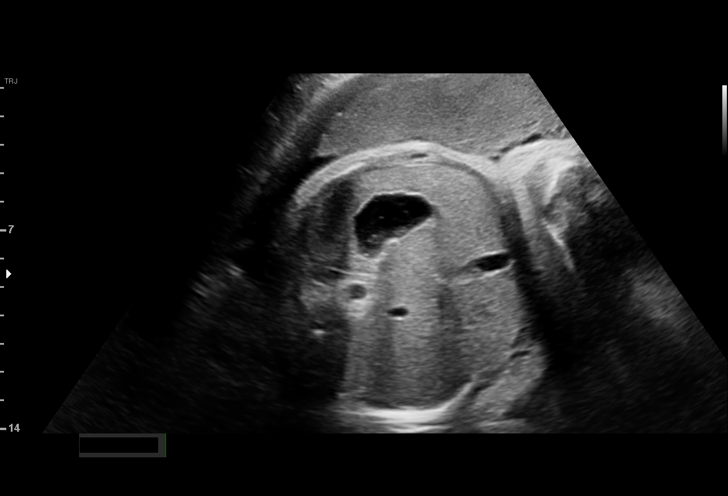
[im 16/23]
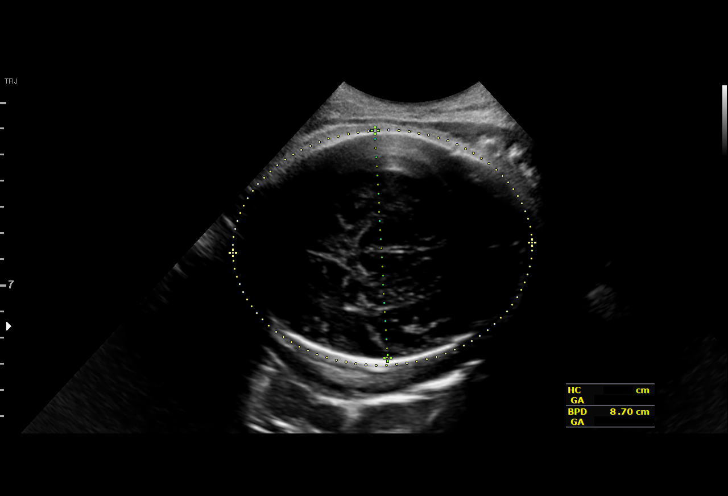
[im 18/23]
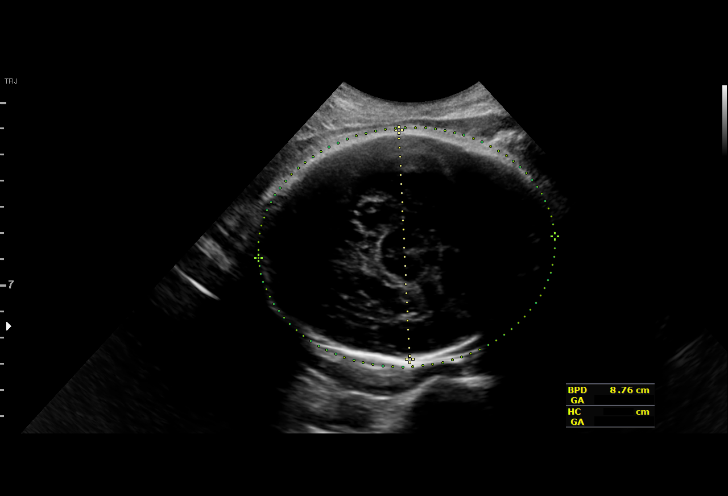
[im 19/23]
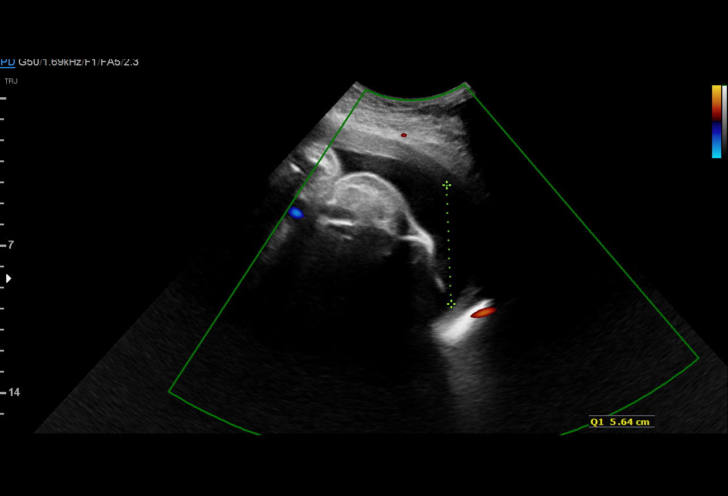
[im 21/23]
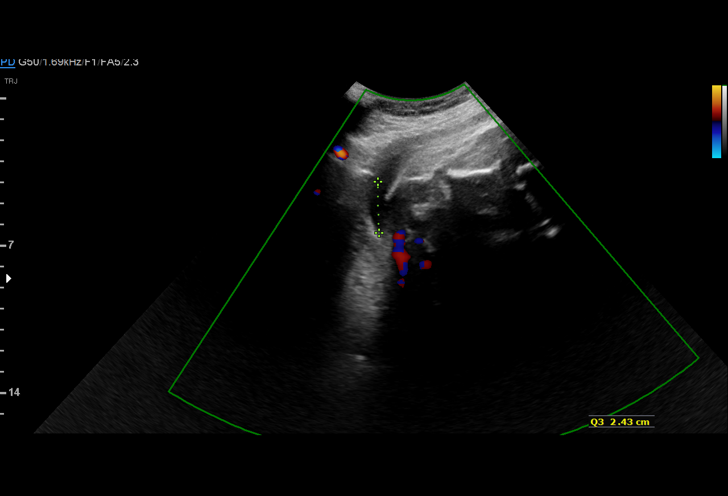
[im 23/23]
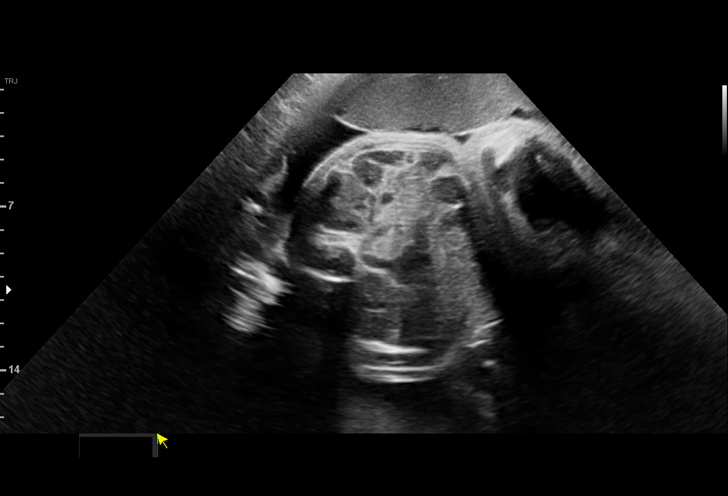

[14 of 23 positions shown; findings below may reference images not displayed]

[REDACTED]care

     STRESS                                            [facility]
                                                       [facility]
 ----------------------------------------------------------------------

 ----------------------------------------------------------------------
Indications

  Poor obstetric history: Previous IUFD (@ 30
  weeks, fetal hydrops and chest mass)
  History of cesarean delivery, currently
  pregnant x 2
  37 weeks gestation of pregnancy
 ----------------------------------------------------------------------
Vital Signs

                                                Height:        5'0"
Fetal Evaluation

 Num Of Fetuses:          1
 Fetal Heart Rate(bpm):   131
 Cardiac Activity:        Observed
 Presentation:            Cephalic
 Placenta:                Anterior

 Amniotic Fluid
 AFI FV:      Within normal limits

 AFI Sum(cm)     %Tile       Largest Pocket(cm)
 18.41           72

 RUQ(cm)       RLQ(cm)       LUQ(cm)        LLQ(cm)

Biophysical Evaluation
 Amniotic F.V:   Pocket => 2 cm two         F. Tone:         Observed
                 planes
 F. Movement:    Observed                   Score:           [DATE]
 F. Breathing:   Observed
Biometry

 BPD:      87.5  mm     G. Age:  35w 2d         10  %    CI:        73.81   %    70 - 86
                                                         FL/HC:       22.3  %    20.9 -
 HC:      323.5  mm     G. Age:  36w 4d          9  %    HC/AC:       0.97       0.92 -
 AC:      334.4  mm     G. Age:  37w 2d         53  %    FL/BPD:      82.4  %    71 - 87
 FL:       72.1  mm     G. Age:  37w 0d         29  %    FL/AC:       21.6  %    20 - 24

 Est. FW:    4412   gm   6 lb 12 oz      55  %
OB History

 Gravidity:    3         Term:   1        Prem:   1        SAB:   0
 TOP:          0       Ectopic:  0        Living: 1
Gestational Age

 LMP:           39w 3d        Date:  04/10/18                 EDD:   01/15/19
 U/S Today:     36w 4d                                        EDD:   02/04/19
 Best:          37w 6d     Det. By:  Early Ultrasound         EDD:   01/26/19
                                     (06/08/18)
Anatomy

 Cranium:               Appears normal         Stomach:                Appears normal, left
                                                                       sided
 Cavum:                 Appears normal         Kidneys:                Appear normal
 Heart:                 Appears normal         Bladder:                Appears normal
                        (4CH, axis, and
                        situs)
Impression

 Normal interval growth.
 Biophysical profile [DATE]
Recommendations

 Follow up as clinically indicated.

## 2019-07-10 ENCOUNTER — Encounter (HOSPITAL_COMMUNITY): Payer: Self-pay

## 2019-07-10 ENCOUNTER — Emergency Department (HOSPITAL_COMMUNITY)
Admission: EM | Admit: 2019-07-10 | Discharge: 2019-07-10 | Disposition: A | Discharge status: Home / Self Care | Payer: Medicaid Other | Attending: Emergency Medicine | Admitting: Emergency Medicine

## 2019-07-10 ENCOUNTER — Other Ambulatory Visit: Payer: Self-pay

## 2019-07-10 DIAGNOSIS — Y939 Activity, unspecified: Secondary | ICD-10-CM | POA: Diagnosis not present

## 2019-07-10 DIAGNOSIS — Y929 Unspecified place or not applicable: Secondary | ICD-10-CM | POA: Diagnosis not present

## 2019-07-10 DIAGNOSIS — S01112A Laceration without foreign body of left eyelid and periocular area, initial encounter: Secondary | ICD-10-CM | POA: Diagnosis present

## 2019-07-10 DIAGNOSIS — Y999 Unspecified external cause status: Secondary | ICD-10-CM | POA: Diagnosis not present

## 2019-07-10 DIAGNOSIS — Z23 Encounter for immunization: Secondary | ICD-10-CM | POA: Insufficient documentation

## 2019-07-10 DIAGNOSIS — Z87891 Personal history of nicotine dependence: Secondary | ICD-10-CM | POA: Diagnosis not present

## 2019-07-10 DIAGNOSIS — W228XXA Striking against or struck by other objects, initial encounter: Secondary | ICD-10-CM | POA: Insufficient documentation

## 2019-07-10 DIAGNOSIS — S0181XA Laceration without foreign body of other part of head, initial encounter: Secondary | ICD-10-CM

## 2019-07-10 MED ORDER — TETANUS-DIPHTH-ACELL PERTUSSIS 5-2.5-18.5 LF-MCG/0.5 IM SUSP
0.5000 mL | Freq: Once | INTRAMUSCULAR | Status: AC
  Administered 2019-07-10: 0.5 mL via INTRAMUSCULAR
  Filled 2019-07-10: qty 0.5

## 2019-07-10 MED ORDER — LIDOCAINE-EPINEPHRINE (PF) 2 %-1:200000 IJ SOLN
10.0000 mL | Freq: Once | INTRAMUSCULAR | Status: AC
  Administered 2019-07-10: 10 mL
  Filled 2019-07-10: qty 10

## 2019-07-10 NOTE — Discharge Instructions (Addendum)
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
## 2019-07-18 ENCOUNTER — Encounter (HOSPITAL_COMMUNITY): Payer: Self-pay | Admitting: Emergency Medicine

## 2019-07-18 ENCOUNTER — Other Ambulatory Visit: Payer: Self-pay

## 2019-07-18 ENCOUNTER — Emergency Department (HOSPITAL_COMMUNITY)
Admission: EM | Admit: 2019-07-18 | Discharge: 2019-07-18 | Disposition: A | Discharge status: Home / Self Care | Payer: Medicaid Other | Attending: Emergency Medicine | Admitting: Emergency Medicine

## 2019-07-18 DIAGNOSIS — Z4802 Encounter for removal of sutures: Secondary | ICD-10-CM

## 2019-07-18 DIAGNOSIS — Z87891 Personal history of nicotine dependence: Secondary | ICD-10-CM | POA: Diagnosis not present

## 2019-07-18 DIAGNOSIS — S01112D Laceration without foreign body of left eyelid and periocular area, subsequent encounter: Secondary | ICD-10-CM | POA: Insufficient documentation

## 2019-07-18 DIAGNOSIS — X58XXXD Exposure to other specified factors, subsequent encounter: Secondary | ICD-10-CM | POA: Insufficient documentation

## 2019-07-18 NOTE — ED Triage Notes (Signed)
Pt presents for suture removal that was done on 07/10/2019.

## 2019-07-18 NOTE — ED Provider Notes (Signed)
[facility]-EMERGENCY DEPT Provider Note   CSN: [id number] Arrival date & time: 07/18/19  0141     History   Chief Complaint Chief Complaint  Patient presents with  . Suture / Staple Removal    HPI Carrie Alvarado is a 28 y.o. female.     The history is provided by the patient.  Suture / Staple Removal This is a new problem. The current episode started more than 1 week ago. The problem occurs constantly. The problem has not changed since onset.Pertinent negatives include no chest pain, no abdominal pain, no headaches and no shortness of breath. Nothing aggravates the symptoms. Nothing relieves the symptoms. She has tried nothing for the symptoms. The treatment provided significant relief.  6 running and 8 running sutures of the left eyebrow placed on 8/31. No f/c/r.  No drainage or redness.   Past Medical History:  Diagnosis Date  . Medical history non-contributory   . Supervision of high risk pregnancy, antepartum 08/12/2018    Nursing Staff Provider Office Location  [facility] Dating   6 wk ultrasound Language   English Anatomy US   Normal 19 wks Flu Vaccine   08/12/2018 Genetic Screen   AFP: Neg (declined additional screening per genetic counseling note) TDaP vaccine   10/26/18 GTT Third trimester:  76-168-127 Rhogam   n/a   LAB RESULTS  Feeding Plan  Breast Blood Type O/Positive/-- (10/04 1033)  Contraception  OCP or    Patient Active Problem List   Diagnosis Date Noted  . Group B Streptococcus carrier, antepartum 01/09/2019  . History of IUFD 12/21/2018  . 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 Anemia affecting pregnancy 08/18/2018  . History of preterm delivery, currently pregnant 08/12/2018  . S/P cesarean section and BTS 03/16/2012    Past Surgical History:  Procedure Laterality Date  . BREAST SURGERY     Implants  . CESAREAN SECTION    . CESAREAN SECTION  03/13/2012   Procedure: CESAREAN SECTION;  Surgeon: Lavina Hamman, MD;   Location: [facility];  Service: Gynecology;  Laterality: N/A;  Repeat cesarean section with delivery of baby girl at 65.  Apgars 9/9.  Marland Kitchen CESAREAN SECTION WITH BILATERAL TUBAL LIGATION Bilateral 01/19/2019   Procedure: CESAREAN SECTION WITH BILATERAL TUBAL LIGATION;  Surgeon: Tereso Newcomer, MD;  Location: [facility];  Service: Obstetrics;  Laterality: Bilateral;  . OTHER SURGICAL HISTORY     Glass removed in arm     OB History    Gravida  3   Para  2   Term  1   Preterm  1   AB  0   Living  1     SAB  0   TAB  0   Ectopic  0   Multiple  0   Live Births  1            Home Medications    Prior to Admission medications   Medication Sig Start Date End Date Taking? Authorizing Provider  ferrous sulfate (FERROUSUL) 325 (65 FE) MG tablet Take 1 tablet (325 mg total) by mouth 2 (two) times daily. Patient not taking: Reported on 03/08/2019 09/23/18   Amedeo Gory, CNM  ibuprofen (ADVIL,MOTRIN) 800 MG tablet Take 1 tablet (800 mg total) by mouth every 6 (six) hours. Patient not taking: Reported on 03/08/2019 01/21/19   Sandi Raveling, MD  senna-docusate (SENOKOT-S) 8.6-50 MG tablet Take 2 tablets by mouth daily. Patient not taking: Reported on 03/08/2019  01/21/19   Justus Memory, MD    Family History Family History  Problem Relation Age of Onset  . Hypotension Neg Hx   . Anesthesia problems Neg Hx     Social History Social History   Tobacco Use  . Smoking status: Former Smoker    Quit date: 05/25/2018    Years since quitting: 1.1  . Smokeless tobacco: Never Used  Substance Use Topics  . Alcohol use: No    Comment: not with pregnancy  . Drug use: No    Types: Marijuana    Comment: not with pregnancy     Allergies   Patient has no known allergies.   Review of Systems Review of Systems  Constitutional: Negative for fever.  Eyes: Negative for visual disturbance.  Respiratory: Negative for shortness of breath.   Cardiovascular: Negative for  chest pain.  Gastrointestinal: Negative for abdominal pain.  Genitourinary: Negative for difficulty urinating.  Musculoskeletal: Negative for arthralgias.  Neurological: Negative for headaches.  Psychiatric/Behavioral: Negative for agitation.  All other systems reviewed and are negative.    Physical Exam Updated Vital Signs BP 111/66 (BP Location: Left Arm)   Pulse 71   Temp 99.2 F (37.3 C) (Oral)   Resp 16   Ht 4\' 11"  (1.499 m)   Wt 55.3 kg   LMP 06/17/2019   SpO2 100%   BMI 24.64 kg/m   Physical Exam Constitutional:      General: She is not in acute distress.    Appearance: She is normal weight.  HENT:     Head: Normocephalic.     Comments: Sutures above the left eyebrow well healed with discharge or redness    Nose: Nose normal.  Eyes:     Conjunctiva/sclera: Conjunctivae normal.     Pupils: Pupils are equal, round, and reactive to light.  Neck:     Musculoskeletal: Normal range of motion and neck supple.  Cardiovascular:     Rate and Rhythm: Normal rate and regular rhythm.     Pulses: Normal pulses.     Heart sounds: Normal heart sounds.  Pulmonary:     Effort: Pulmonary effort is normal.     Breath sounds: Normal breath sounds.  Abdominal:     General: Abdomen is flat. Bowel sounds are normal.     Palpations: Abdomen is soft.  Musculoskeletal: Normal range of motion.  Skin:    General: Skin is warm and dry.     Capillary Refill: Capillary refill takes less than 2 seconds.  Neurological:     General: No focal deficit present.     Mental Status: She is alert and oriented to person, place, and time.  Psychiatric:        Mood and Affect: Mood normal.        Behavior: Behavior normal.      ED Treatments / Results  Labs (all labs ordered are listed, but only abnormal results are displayed) Labs Reviewed - No data to display  EKG None  Radiology No results found.  Procedures .Suture Removal  Date/Time: 07/18/2019 3:03 AM Performed by: Veatrice Kells, MD Authorized by: Veatrice Kells, MD   Consent:    Consent obtained:  Verbal   Consent given by:  Patient   Risks discussed:  Bleeding, pain and wound separation   Alternatives discussed:  Observation Location:    Location:  Head/neck   Head/neck location:  Eyebrow Procedure details:    Wound appearance:  No signs of infection   Number  of sutures removed:  14   Number of staples removed:  0 Post-procedure details:    Post-removal:  No dressing applied   Patient tolerance of procedure:  Tolerated well, no immediate complications   (including critical care time)  Medications Ordered in ED Medications - No data to display   Well healed wound.  Expectant management.   Carrie KavaCynthia Kowalke was evaluated in Emergency Department on 07/18/2019 for the symptoms described in the history of present illness. She was evaluated in the context of the global COVID-19 pandemic, which necessitated consideration that the patient might be at risk for infection with the SARS-CoV-2 virus that causes COVID-19. Institutional protocols and algorithms that pertain to the evaluation of patients at risk for COVID-19 are in a state of rapid change based on information released by regulatory bodies including the CDC and federal and state organizations. These policies and algorithms were followed during the patient's care in the ED.   Final Clinical Impressions(s) / ED Diagnoses   Return for intractable cough, coughing up blood,fevers >100.4 unrelieved by medication, shortness of breath, intractable vomiting, chest pain, shortness of breath, weakness,numbness, changes in speech, facial asymmetry,abdominal pain, passing out,Inability to tolerate liquids or food, cough, altered mental status or any concerns. No signs of systemic illness or infection. The patient is nontoxic-appearing on exam and vital signs are within normal limits.   I have reviewed the triage vital signs and the nursing notes. Pertinent  labs &imaging results that were available during my care of the patient were reviewed by me and considered in my medical decision making (see chart for details). After history, exam, and medical workup I feel the patient has beenappropriately medically screened and is safe for discharge home. Pertinent diagnoses were discussed with the patient. Patient was givenreturn precautions   Carrie Fatima, MD 07/18/19 [id number]

## 2019-11-28 IMAGING — US US MFM FETAL BPP W/O NON-STRESS
1 series · 15 of 28 positions shown · non-contrast
Comparison: none

[Series 1: us mfm fetal bpp w/o non-stress · 36 acquisitions, 15 frames shown]
[im 1/36]
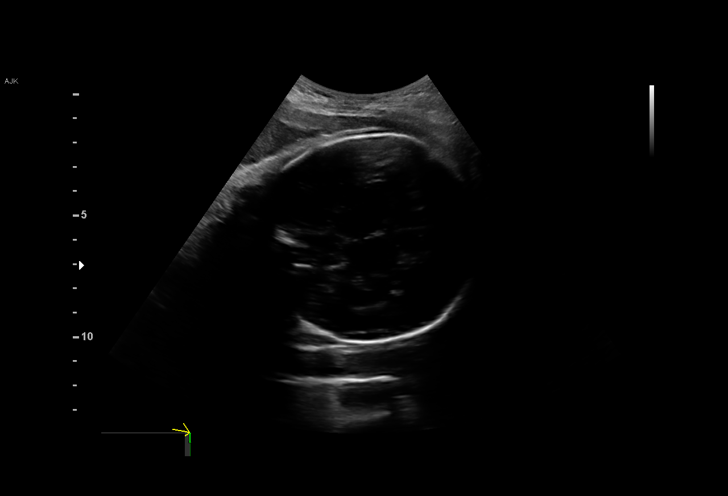
[im 3/36]
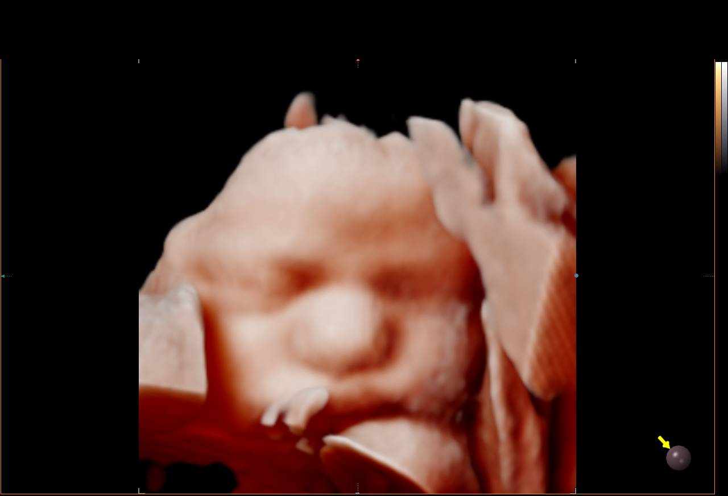
[im 6/36]
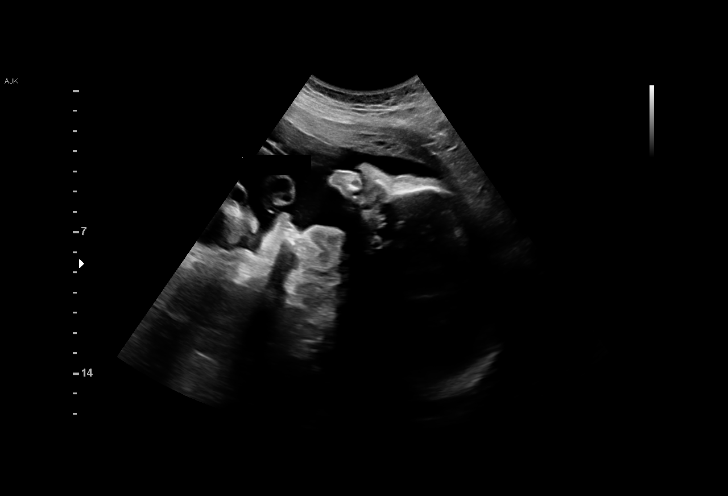
[im 8/36]
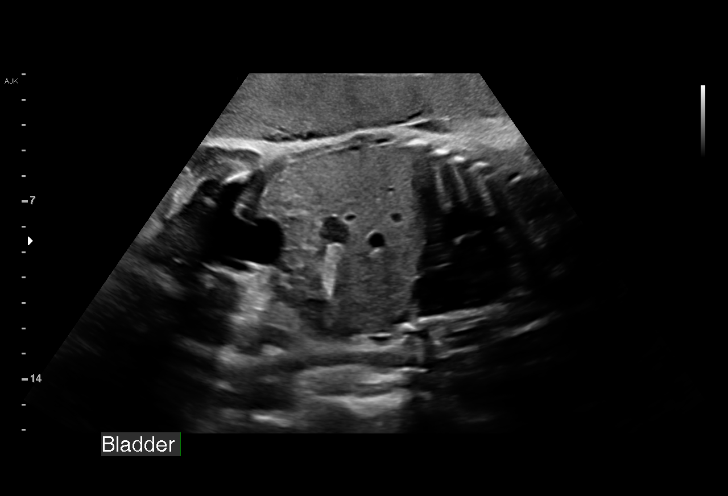
[im 11/36]
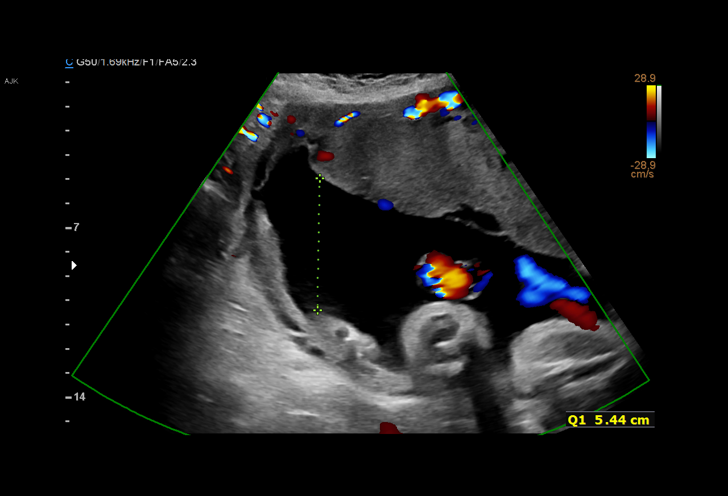
[im 13/36]
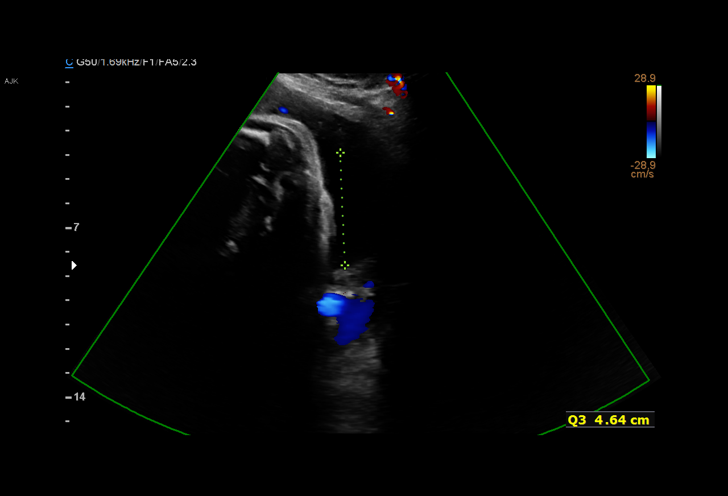
[im 16/36]
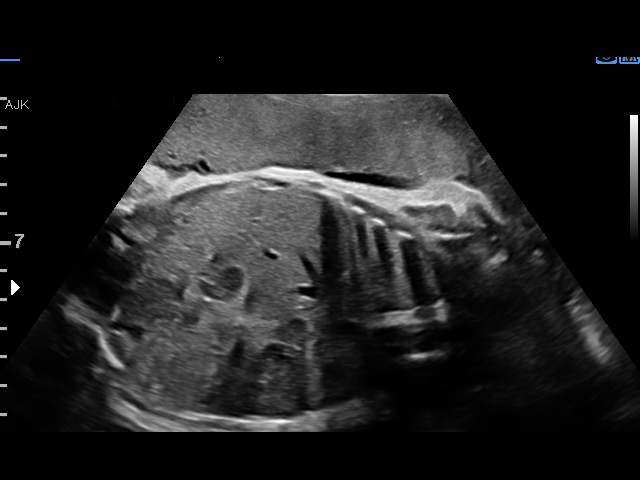
[im 19/36]
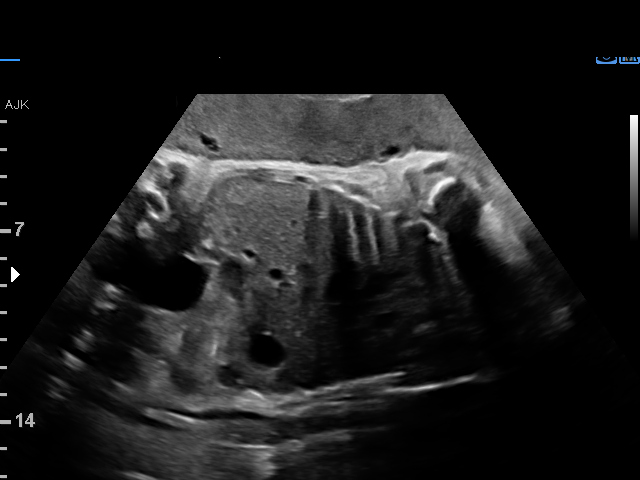
[im 20/36]
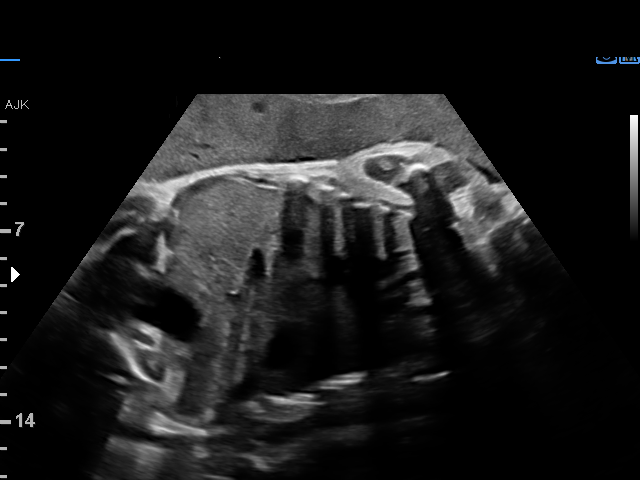
[im 23/36]
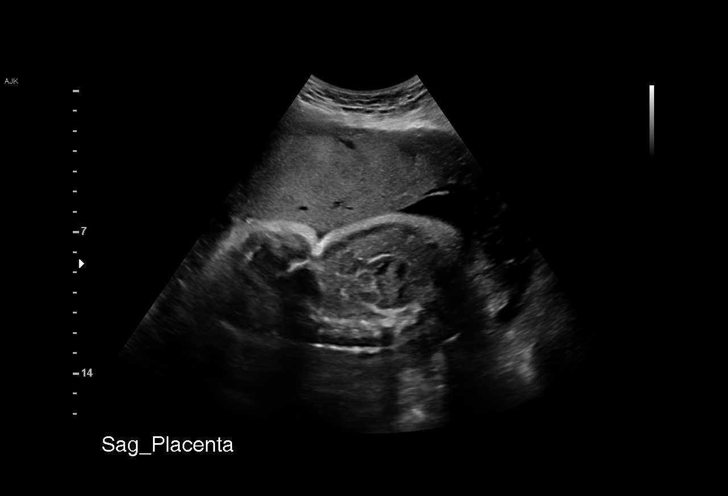
[im 25/36]
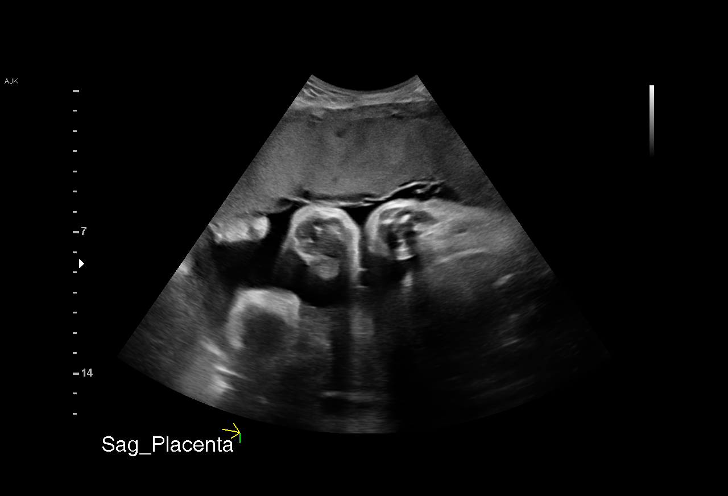
[im 28/36]
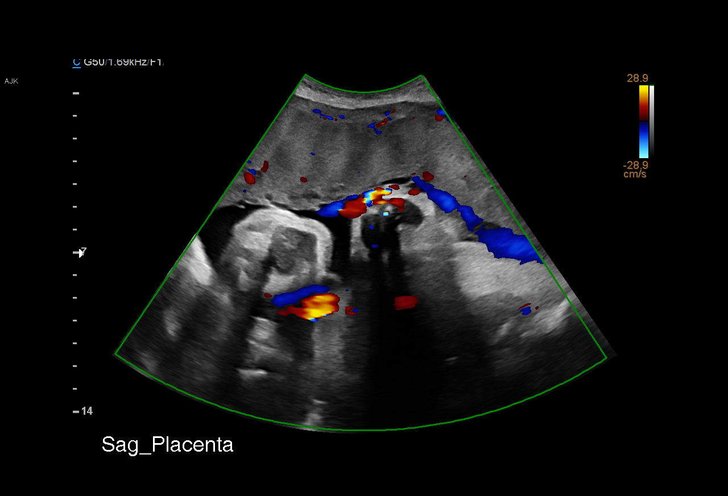
[im 30/36]
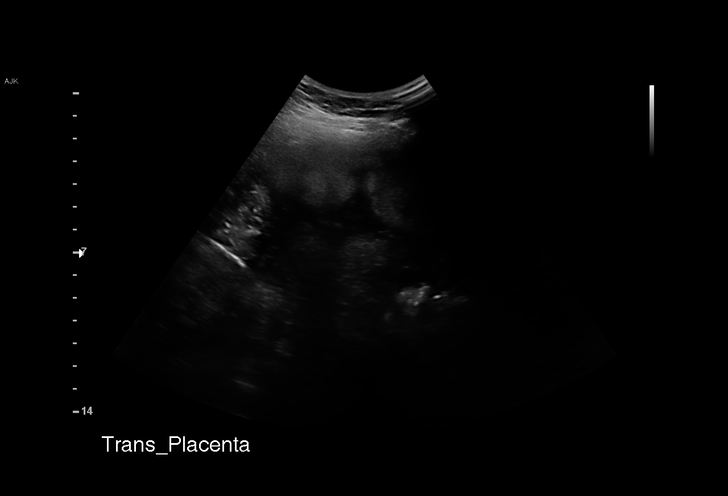
[im 33/36]
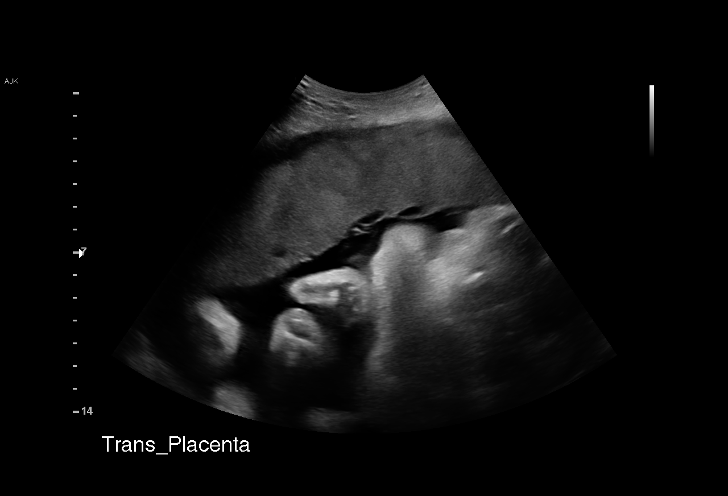
[im 36/36]
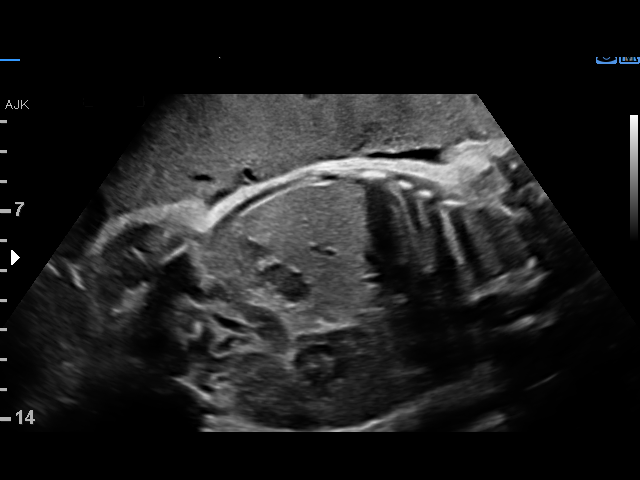

[15 of 28 positions shown; findings below may reference images not displayed]

for [REDACTED]care

 ----------------------------------------------------------------------

 ----------------------------------------------------------------------
Indications

  Poor obstetric history: Previous IUFD (@ 30
  weeks, fetal hydrops and chest mass)
  History of cesarean delivery, currently
  pregnant x 2
  34 weeks gestation of pregnancy
 ----------------------------------------------------------------------
Vital Signs

                                                 Height:        5'0"
Fetal Evaluation

 Num Of Fetuses:          1
 Cardiac Activity:        Observed
 Presentation:            Cephalic
 Placenta:                Anterior
 P. Cord Insertion:       Visualized, central

 Amniotic Fluid
 AFI FV:      Within normal limits

 AFI Sum(cm)     %Tile       Largest Pocket(cm)
 15.21           55

 RUQ(cm)       RLQ(cm)        LUQ(cm)        LLQ(cm)


 Comment:     Bladder, stomach, diaphragm seen.
Biophysical Evaluation
 Amniotic F.V:   Within normal limits        F. Tone:         Observed
 F. Movement:    Observed                    Score:           [DATE]
 F. Breathing:   Observed
OB History

 Gravidity:     3         Term:  1          Prem:  1        SAB:   0
 TOP:           0       Ectopic: 0         Living: 1
Gestational Age

 LMP:            36w 3d       Date:  04/10/18                   EDD:  01/15/19
 Best:           34w 6d    Det. By:  Early Ultrasound           EDD:  01/26/19
                                     (06/08/18)
Impression

 Biophysical profile [DATE]
Recommendations

 Follow up BPP in 1 week.

## 2019-12-05 IMAGING — US US MFM FETAL BPP W/ NON-STRESS
1 series · 8 of 8 positions shown · non-contrast
Comparison: none

[Series 1: us mfm fetal bpp w/ non-stress · 8 acquisitions, 8 frames shown]
[im 1/8]
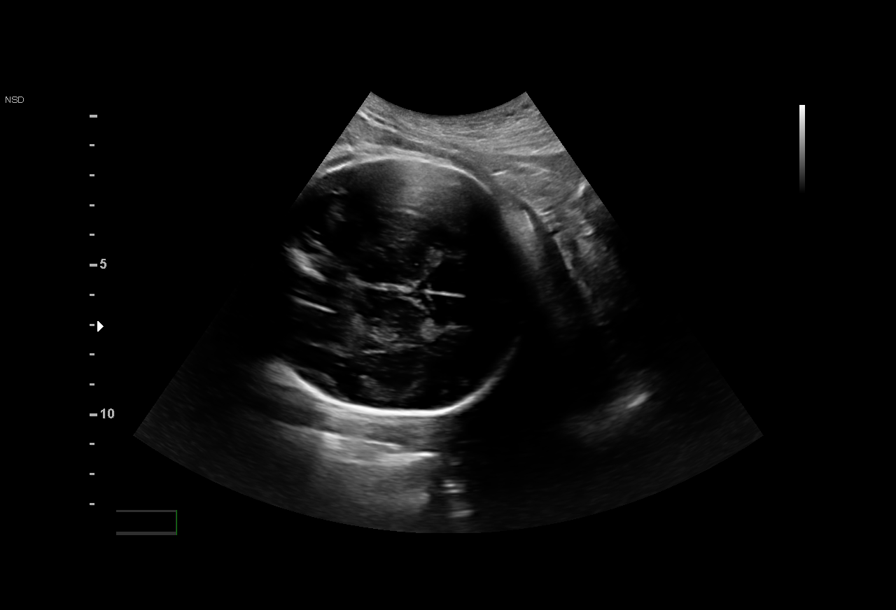
[im 2/8]
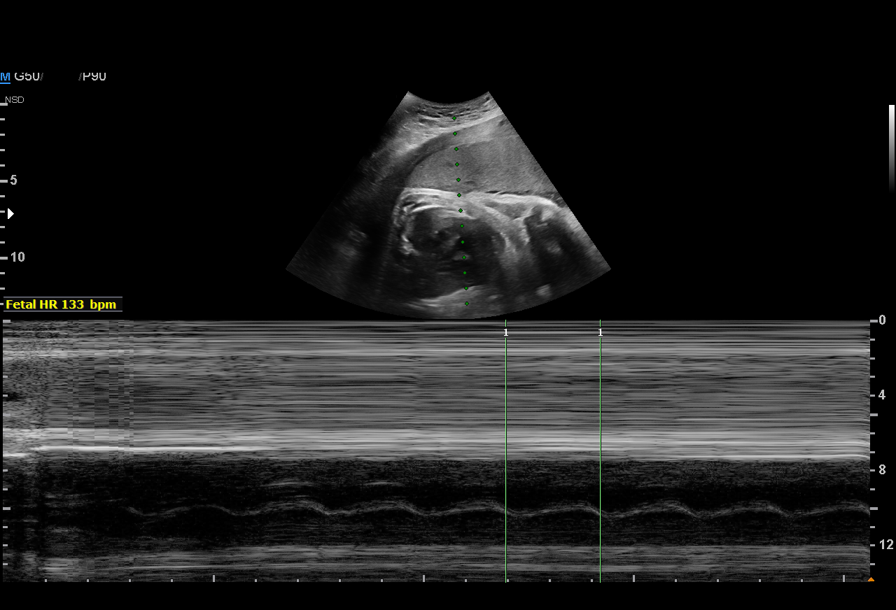
[im 3/8]
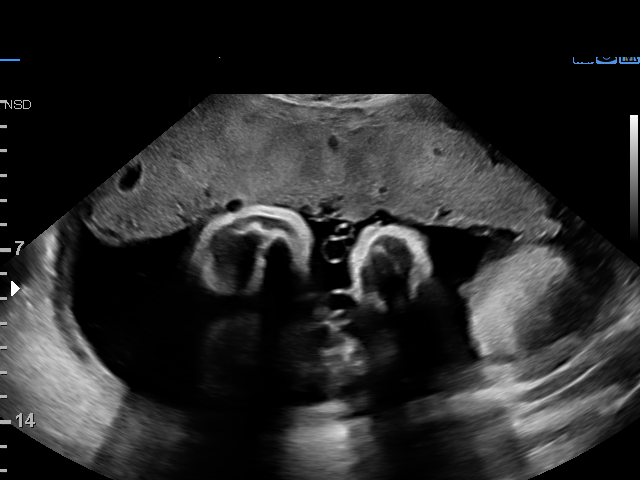
[im 4/8]
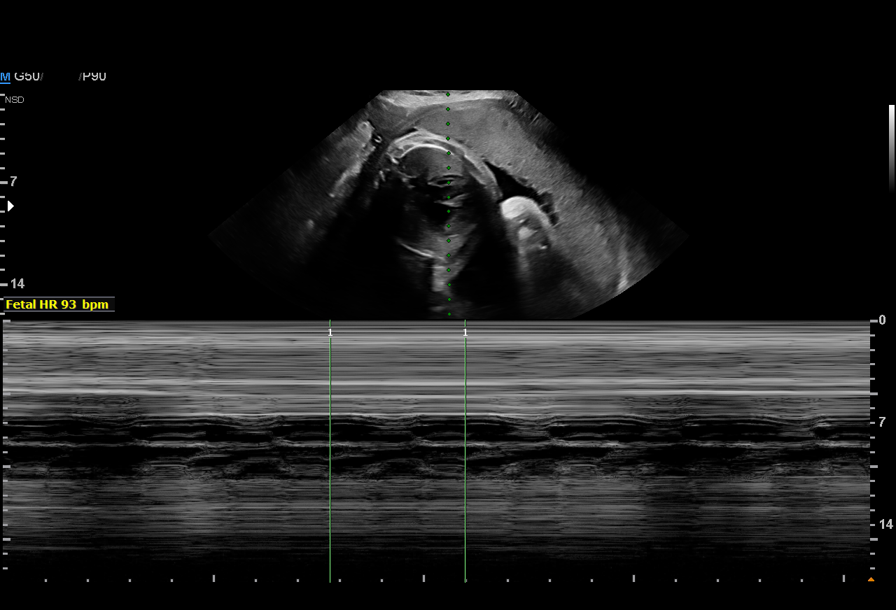
[im 5/8]
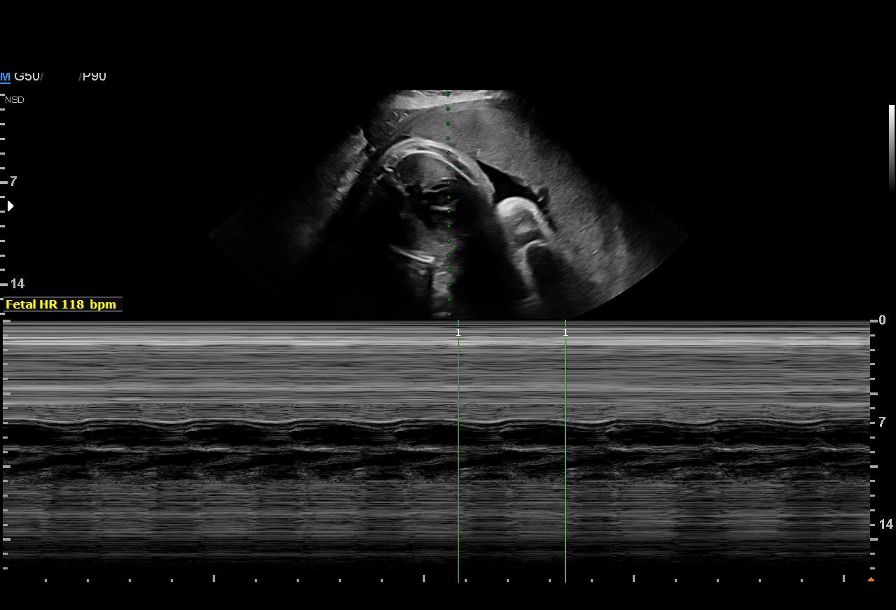
[im 6/8]
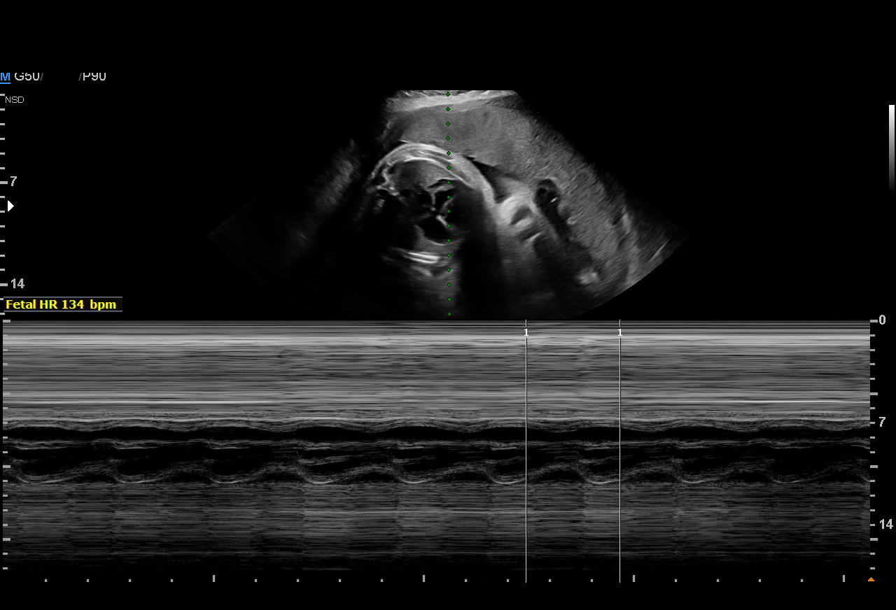
[im 7/8]
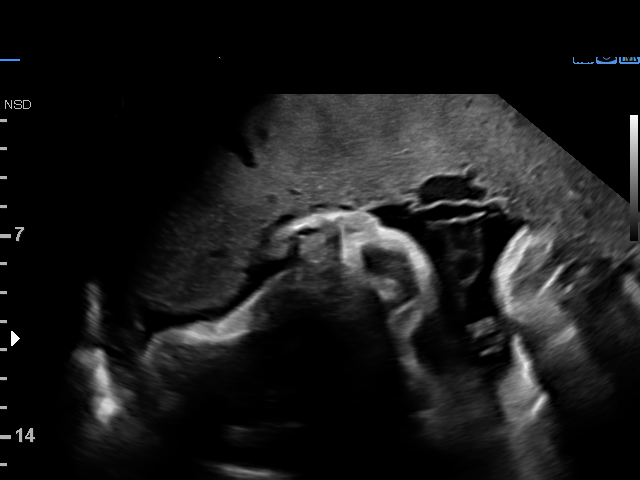
[im 8/8]
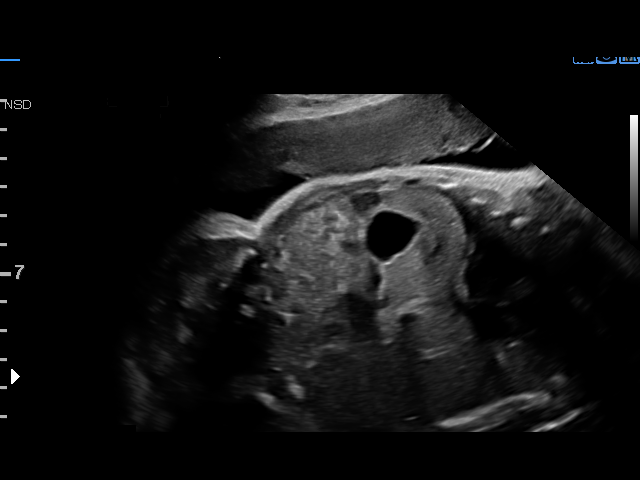

[8 of 8 positions shown; findings below may reference images not displayed]

[REDACTED]care

     W/NONSTRESS                                       UYUNA
 ----------------------------------------------------------------------

 ----------------------------------------------------------------------
Indications

  Poor obstetric history: Previous IUFD (@ 30
  weeks, fetal hydrops and chest mass)
  35 weeks gestation of pregnancy
  History of cesarean delivery, currently
  pregnant x 2
 ----------------------------------------------------------------------
Vital Signs

                                                Height:        5'0"
Fetal Evaluation

 Num Of Fetuses:          1
 Fetal Heart Rate(bpm):   134
 Cardiac Activity:        Observed
 Presentation:            Cephalic

 Amniotic Fluid
 AFI FV:      Within normal limits

 AFI Sum(cm)     %Tile       Largest Pocket(cm)
 20.66           78

 RUQ(cm)       RLQ(cm)       LUQ(cm)        LLQ(cm)

Biophysical Evaluation

 Amniotic F.V:   Within normal limits       F. Tone:         Observed
 F. Movement:    Observed                   N.S.T:           Nonreactive
 F. Breathing:   Observed                   Score:           [DATE]
OB History

 Gravidity:    3         Term:   1        Prem:   1        SAB:   0
 TOP:          0       Ectopic:  0        Living: 1
Gestational Age

 LMP:           37w 3d        Date:  04/10/18                 EDD:   01/15/19
 Best:          35w 6d     Det. By:  Early Ultrasound         EDD:   01/26/19
                                     (06/08/18)
Impression

 Biophysical profile [DATE]
 Deceleration to 93 bpm observed during the exam. NST
 performed- early decelaration observed NRNST 1
 acceleration seen
 Discussed with Faculty provider.
Recommendations

 TO JIM for further monitoring.
 If undelivered repeat BPP on [REDACTED].

## 2019-12-18 ENCOUNTER — Encounter: Payer: Self-pay | Admitting: General Practice

## 2020-07-11 ENCOUNTER — Other Ambulatory Visit: Payer: Self-pay

## 2020-07-11 ENCOUNTER — Ambulatory Visit (INDEPENDENT_AMBULATORY_CARE_PROVIDER_SITE_OTHER): Payer: BLUE CROSS/BLUE SHIELD

## 2020-07-11 DIAGNOSIS — Z23 Encounter for immunization: Secondary | ICD-10-CM | POA: Diagnosis not present

## 2020-07-11 NOTE — Progress Notes (Signed)
   Covid-19 Vaccination Clinic  Name:  Carrie Alvarado    MRN: [id number] DOB: Mar 22, 1991  07/11/2020  [name] was observed post Covid-19 immunization for 15 minutes without incident. She was provided with Vaccine Information Sheet and instruction to access the V-Safe system.   [name] was instructed to call 911 with any severe reactions post vaccine: Marland Kitchen Difficulty breathing  . Swelling of face and throat  . A fast heartbeat  . A bad rash all over body  . Dizziness and weakness   Immunizations Administered    Name Date Dose VIS Date Route   Pfizer COVID-19 Vaccine 07/11/2020  3:51 PM 0.3 mL 01/03/2019 Intramuscular   Manufacturer: [employer], [employer]   Lot: [id number]   NDC: [address]-2

## 2020-08-03 ENCOUNTER — Other Ambulatory Visit: Payer: Self-pay

## 2020-08-03 ENCOUNTER — Ambulatory Visit (INDEPENDENT_AMBULATORY_CARE_PROVIDER_SITE_OTHER): Payer: BLUE CROSS/BLUE SHIELD

## 2020-08-03 DIAGNOSIS — Z23 Encounter for immunization: Secondary | ICD-10-CM | POA: Diagnosis not present

## 2020-08-03 NOTE — Progress Notes (Signed)
   Covid-19 Vaccination Clinic  Name:  Carrie Alvarado    MRN: [id number] DOB: 1991/03/13  08/03/2020  Ms. [name] was observed post Covid-19 immunization for 15 minutes without incident. She was provided with Vaccine Information Sheet and instruction to access the V-Safe system.   Ms. [name] was instructed to call 911 with any severe reactions post vaccine: Marland Kitchen Difficulty breathing  . Swelling of face and throat  . A fast heartbeat  . A bad rash all over body  . Dizziness and weakness   Immunizations Administered    Name Date Dose VIS Date Route   Pfizer COVID-19 Vaccine 08/03/2020 11:03 AM 0.3 mL 01/03/2019 Intramuscular   Manufacturer: [employer], [employer]   Lot: [id number]   NDC: [id number]

## 2021-04-09 DIAGNOSIS — Z419 Encounter for procedure for purposes other than remedying health state, unspecified: Secondary | ICD-10-CM | POA: Diagnosis not present

## 2021-05-09 DIAGNOSIS — Z419 Encounter for procedure for purposes other than remedying health state, unspecified: Secondary | ICD-10-CM | POA: Diagnosis not present

## 2021-06-09 DIAGNOSIS — Z419 Encounter for procedure for purposes other than remedying health state, unspecified: Secondary | ICD-10-CM | POA: Diagnosis not present

## 2021-07-10 DIAGNOSIS — Z419 Encounter for procedure for purposes other than remedying health state, unspecified: Secondary | ICD-10-CM | POA: Diagnosis not present

## 2021-08-09 DIAGNOSIS — Z419 Encounter for procedure for purposes other than remedying health state, unspecified: Secondary | ICD-10-CM | POA: Diagnosis not present

## 2021-09-09 DIAGNOSIS — Z419 Encounter for procedure for purposes other than remedying health state, unspecified: Secondary | ICD-10-CM | POA: Diagnosis not present

## 2021-10-09 DIAGNOSIS — Z419 Encounter for procedure for purposes other than remedying health state, unspecified: Secondary | ICD-10-CM | POA: Diagnosis not present

## 2021-11-09 DIAGNOSIS — Z419 Encounter for procedure for purposes other than remedying health state, unspecified: Secondary | ICD-10-CM | POA: Diagnosis not present

## 2021-11-21 ENCOUNTER — Other Ambulatory Visit (HOSPITAL_COMMUNITY)
Admission: RE | Admit: 2021-11-21 | Discharge: 2021-11-21 | Disposition: A | Discharge status: Home / Self Care | Payer: BLUE CROSS/BLUE SHIELD | Source: Ambulatory Visit | Attending: Obstetrics and Gynecology | Admitting: Obstetrics and Gynecology

## 2021-11-21 ENCOUNTER — Encounter: Payer: Self-pay | Admitting: Obstetrics and Gynecology

## 2021-11-21 ENCOUNTER — Other Ambulatory Visit: Payer: Self-pay

## 2021-11-21 ENCOUNTER — Ambulatory Visit (INDEPENDENT_AMBULATORY_CARE_PROVIDER_SITE_OTHER): Payer: Medicaid Other | Admitting: Obstetrics and Gynecology

## 2021-11-21 VITALS — BP 103/62 | HR 76 | Temp 97.9°F | Ht 60.0 in | Wt 116.6 lb

## 2021-11-21 DIAGNOSIS — Z113 Encounter for screening for infections with a predominantly sexual mode of transmission: Secondary | ICD-10-CM | POA: Insufficient documentation

## 2021-11-21 DIAGNOSIS — Z01419 Encounter for gynecological examination (general) (routine) without abnormal findings: Secondary | ICD-10-CM | POA: Diagnosis not present

## 2021-11-21 DIAGNOSIS — N898 Other specified noninflammatory disorders of vagina: Secondary | ICD-10-CM

## 2021-11-21 NOTE — Progress Notes (Signed)
Subjective:     Gerlean Cid is a 31 y.o. female with LMP 11/14/21 and BMI 22 who is here for a comprehensive physical exam. The patient reports no problems. Patient reports a monthly period lasting 4-7 days. She is sexually active using BTL for contraception. She denies urinary incontinence. She denies pelvic pain or abnormal discharge  Past Medical History:  Diagnosis Date   Medical history non-contributory    Mental disorder    Supervision of high risk pregnancy, antepartum 08/12/2018    Nursing Staff Provider Office Location  [facility] Dating   6 wk ultrasound Language   English Anatomy US   Normal 19 wks Flu Vaccine   08/12/2018 Genetic Screen   AFP: Neg (declined additional screening per genetic counseling note) TDaP vaccine   10/26/18 GTT Third trimester:  76-168-127 Rhogam   n/a   LAB RESULTS  Feeding Plan  Breast Blood Type O/Positive/-- (10/04 1033)  Contraception  OCP or   Past Surgical History:  Procedure Laterality Date   BREAST SURGERY     Implants   CESAREAN SECTION     CESAREAN SECTION  03/13/2012   Procedure: CESAREAN SECTION;  Surgeon: Lavina Hamman, MD;  Location: [facility];  Service: Gynecology;  Laterality: N/A;  Repeat cesarean section with delivery of baby girl at 6.  Apgars 9/9.   CESAREAN SECTION WITH BILATERAL TUBAL LIGATION Bilateral 01/19/2019   Procedure: CESAREAN SECTION WITH BILATERAL TUBAL LIGATION;  Surgeon: Tereso Newcomer, MD;  Location: [facility];  Service: Obstetrics;  Laterality: Bilateral;   OTHER SURGICAL HISTORY     Glass removed in arm   Family History  Problem Relation Age of Onset   Hypotension Neg Hx    Anesthesia problems Neg Hx     Social History   Socioeconomic History   Marital status: Married    Spouse name: Not on file   Number of children: 2   Years of education: Not on file   Highest education level: Not on file  Occupational History   Not on file  Tobacco Use   Smoking status: Former    Types: Cigarettes    Quit date:  05/25/2018    Years since quitting: 3.4    Passive exposure: Never   Smokeless tobacco: Never  Vaping Use   Vaping Use: Never used  Substance and Sexual Activity   Alcohol use: Yes    Comment: socially   Drug use: No    Types: Marijuana    Comment: not with pregnancy   Sexual activity: Yes    Birth control/protection: Surgical  Other Topics Concern   Not on file  Social History Narrative   Not on file   Social Determinants of Health   Financial Resource Strain: Not on file  Food Insecurity: Not on file  Transportation Needs: Not on file  Physical Activity: Not on file  Stress: Not on file  Social Connections: Not on file  Intimate Partner Violence: Not on file   Health Maintenance  Topic Date Due   Pneumococcal Vaccine 26-70 Years old (1 - PCV) Never done   Hepatitis C Screening  Never done   COVID-19 Vaccine (3 - Pfizer risk series) 08/31/2020   INFLUENZA VACCINE  06/09/2021   PAP-Cervical Cytology Screening  08/12/2021   PAP SMEAR-Modifier  08/12/2021   TETANUS/TDAP  07/09/2029   HIV Screening  Completed   HPV VACCINES  Aged Out       Review of Systems Pertinent items noted in HPI and remainder  of comprehensive ROS otherwise negative.   Objective:  Blood pressure 103/62, pulse 76, temperature 97.9 F (36.6 C), temperature source Oral, height 5' (1.524 m), weight 116 lb 9.6 oz (52.9 kg), last menstrual period 11/14/2021.   GENERAL: Well-developed, well-nourished female in no acute distress.  HEENT: Normocephalic, atraumatic. Sclerae anicteric.  NECK: Supple. Normal thyroid.  LUNGS: Clear to auscultation bilaterally.  HEART: Regular rate and rhythm. BREASTS: Symmetric in size. No palpable masses or lymphadenopathy, skin changes, or nipple drainage. Patient had a breast augmentation ABDOMEN: Soft, nontender, nondistended. No organomegaly. PELVIC: Normal external female genitalia. Vagina is pink and rugated.  Normal discharge. Normal appearing cervix. Uterus is  normal in size. No adnexal mass or tenderness. Chaperone present during the pelvic exam EXTREMITIES: No cyanosis, clubbing, or edema, 2+ distal pulses.     Assessment:    Healthy female exam.      Plan:    Pap smear collected STI screening per patient request Patient will be contacted with abnormal results See After Visit Summary for Counseling Recommendations

## 2021-11-22 LAB — RPR+HBSAG+HCVAB+...
HIV Screen 4th Generation wRfx: NONREACTIVE
Hep C Virus Ab: <0.1 s/co ratio (ref 0.0–0.9)
Hepatitis B Surface Ag: NEGATIVE
RPR Ser Ql: NONREACTIVE

## 2021-11-24 LAB — CERVICOVAGINAL ANCILLARY ONLY
Bacterial Vaginitis (gardnerella): NEGATIVE
Candida Glabrata: NEGATIVE
Candida Vaginitis: NEGATIVE
Chlamydia: NEGATIVE
Comment: NEGATIVE
Comment: NEGATIVE
Comment: NEGATIVE
Comment: NEGATIVE
Comment: NEGATIVE
Comment: NORMAL
Neisseria Gonorrhea: NEGATIVE
Trichomonas: NEGATIVE

## 2021-11-26 LAB — CYTOLOGY - PAP
Adequacy: ABSENT
Diagnosis: NEGATIVE

## 2021-12-10 DIAGNOSIS — Z419 Encounter for procedure for purposes other than remedying health state, unspecified: Secondary | ICD-10-CM | POA: Diagnosis not present

## 2022-01-07 DIAGNOSIS — Z419 Encounter for procedure for purposes other than remedying health state, unspecified: Secondary | ICD-10-CM | POA: Diagnosis not present

## 2022-02-07 DIAGNOSIS — Z419 Encounter for procedure for purposes other than remedying health state, unspecified: Secondary | ICD-10-CM | POA: Diagnosis not present

## 2022-03-09 DIAGNOSIS — Z419 Encounter for procedure for purposes other than remedying health state, unspecified: Secondary | ICD-10-CM | POA: Diagnosis not present

## 2022-04-09 DIAGNOSIS — Z419 Encounter for procedure for purposes other than remedying health state, unspecified: Secondary | ICD-10-CM | POA: Diagnosis not present

## 2022-05-09 DIAGNOSIS — Z419 Encounter for procedure for purposes other than remedying health state, unspecified: Secondary | ICD-10-CM | POA: Diagnosis not present

## 2022-06-09 DIAGNOSIS — Z419 Encounter for procedure for purposes other than remedying health state, unspecified: Secondary | ICD-10-CM | POA: Diagnosis not present

## 2022-07-10 DIAGNOSIS — Z419 Encounter for procedure for purposes other than remedying health state, unspecified: Secondary | ICD-10-CM | POA: Diagnosis not present

## 2022-08-09 DIAGNOSIS — Z419 Encounter for procedure for purposes other than remedying health state, unspecified: Secondary | ICD-10-CM | POA: Diagnosis not present

## 2022-09-09 DIAGNOSIS — Z419 Encounter for procedure for purposes other than remedying health state, unspecified: Secondary | ICD-10-CM | POA: Diagnosis not present

## 2022-10-09 DIAGNOSIS — Z419 Encounter for procedure for purposes other than remedying health state, unspecified: Secondary | ICD-10-CM | POA: Diagnosis not present

## 2022-11-09 DIAGNOSIS — Z419 Encounter for procedure for purposes other than remedying health state, unspecified: Secondary | ICD-10-CM | POA: Diagnosis not present

## 2022-12-10 DIAGNOSIS — Z419 Encounter for procedure for purposes other than remedying health state, unspecified: Secondary | ICD-10-CM | POA: Diagnosis not present

## 2023-01-08 DIAGNOSIS — Z419 Encounter for procedure for purposes other than remedying health state, unspecified: Secondary | ICD-10-CM | POA: Diagnosis not present

## 2023-02-08 DIAGNOSIS — Z419 Encounter for procedure for purposes other than remedying health state, unspecified: Secondary | ICD-10-CM | POA: Diagnosis not present

## 2023-03-10 DIAGNOSIS — Z419 Encounter for procedure for purposes other than remedying health state, unspecified: Secondary | ICD-10-CM | POA: Diagnosis not present

## 2023-04-10 DIAGNOSIS — Z419 Encounter for procedure for purposes other than remedying health state, unspecified: Secondary | ICD-10-CM | POA: Diagnosis not present

## 2023-05-10 DIAGNOSIS — Z419 Encounter for procedure for purposes other than remedying health state, unspecified: Secondary | ICD-10-CM | POA: Diagnosis not present

## 2023-05-17 ENCOUNTER — Other Ambulatory Visit: Payer: Self-pay

## 2023-05-17 ENCOUNTER — Emergency Department (HOSPITAL_BASED_OUTPATIENT_CLINIC_OR_DEPARTMENT_OTHER)
Admission: EM | Admit: 2023-05-17 | Discharge: 2023-05-17 | Disposition: A | Discharge status: Home / Self Care | Payer: BLUE CROSS/BLUE SHIELD | Attending: Emergency Medicine | Admitting: Emergency Medicine

## 2023-05-17 ENCOUNTER — Encounter (HOSPITAL_BASED_OUTPATIENT_CLINIC_OR_DEPARTMENT_OTHER): Payer: Self-pay

## 2023-05-17 DIAGNOSIS — T162XXA Foreign body in left ear, initial encounter: Secondary | ICD-10-CM

## 2023-05-17 DIAGNOSIS — X58XXXA Exposure to other specified factors, initial encounter: Secondary | ICD-10-CM | POA: Diagnosis not present

## 2023-05-17 NOTE — ED Provider Notes (Signed)
  [facility] Provider Note   CSN: [id number] Arrival date & time: 05/17/23  0320     History  Chief Complaint  Patient presents with   Foreign Body in Ear    Carrie Alvarado is a 32 y.o. female.  The history is provided by the patient.  Foreign Body in Ear This is a new problem. The current episode started less than 1 hour ago. The problem occurs constantly. Nothing aggravates the symptoms.   Patient reports she was using a Q-tip in her left ear when it broke off and she got the cotton stuck in her ear No bleeding or drainage from the ear.  No other complaints    Home Medications Prior to Admission medications   Not on File      Allergies    Patient has no known allergies.    Review of Systems   Review of Systems  Physical Exam Updated Vital Signs BP 118/86 (BP Location: Right Arm)   Pulse 65   Temp 98.1 F (36.7 C) (Oral)   Resp 18   Ht 1.524 m (5')   Wt 55.3 kg   LMP 05/17/2023 (Exact Date)   SpO2 100%   BMI 23.83 kg/m  Physical Exam CONSTITUTIONAL: Well developed/well nourished HEAD: Normocephalic/atraumatic EYES: EOMI/PERRL ENMT: Mucous membranes moist, right TM clear and intact.  Left TM obscured by Q-tip NEURO: Pt is awake/alert/appropriate, moves all extremitiesx4.  No facial droop.   SKIN: warm, color normal PSYCH: anxious  ED Results / Procedures / Treatments   Labs (all labs ordered are listed, but only abnormal results are displayed) Labs Reviewed - No data to display  EKG None  Radiology No results found.  Procedures .Foreign Body Removal  Date/Time: 05/17/2023 3:45 AM  Performed by: Zadie Rhine, MD Authorized by: Zadie Rhine, MD  Consent: Verbal consent obtained. Body area: ear Location details: left ear  Sedation: Patient sedated: no  Patient restrained: no Patient cooperative: yes Localization method: ENT speculum and visualized Removal mechanism: alligator  forceps Complexity: simple 1 objects recovered. Post-procedure assessment: foreign body removed Patient tolerance: patient tolerated the procedure well with no immediate complications Comments: After Q-tip was removed, left TM is visualized and is intact.  No bleeding or discharge is noted      Medications Ordered in ED Medications - No data to display  ED Course/ Medical Decision Making/ A&P                             Medical Decision Making          Final Clinical Impression(s) / ED Diagnoses Final diagnoses:  Foreign body of left ear, initial encounter    Rx / DC Orders ED Discharge Orders     None         Zadie Rhine, MD 05/17/23 0401

## 2023-05-17 NOTE — ED Triage Notes (Signed)
Pt reports getting Q-tip cotton stuck in ear about 40 minutes ago. States her hearing is "echo-y"

## 2023-06-10 DIAGNOSIS — Z419 Encounter for procedure for purposes other than remedying health state, unspecified: Secondary | ICD-10-CM | POA: Diagnosis not present

## 2023-07-11 DIAGNOSIS — Z419 Encounter for procedure for purposes other than remedying health state, unspecified: Secondary | ICD-10-CM | POA: Diagnosis not present

## 2023-07-28 ENCOUNTER — Ambulatory Visit: Payer: BLUE CROSS/BLUE SHIELD | Admitting: Physician Assistant

## 2023-08-10 DIAGNOSIS — Z419 Encounter for procedure for purposes other than remedying health state, unspecified: Secondary | ICD-10-CM | POA: Diagnosis not present

## 2023-09-10 DIAGNOSIS — Z419 Encounter for procedure for purposes other than remedying health state, unspecified: Secondary | ICD-10-CM | POA: Diagnosis not present

## 2023-10-10 DIAGNOSIS — Z419 Encounter for procedure for purposes other than remedying health state, unspecified: Secondary | ICD-10-CM | POA: Diagnosis not present

## 2023-11-10 DIAGNOSIS — Z419 Encounter for procedure for purposes other than remedying health state, unspecified: Secondary | ICD-10-CM | POA: Diagnosis not present

## 2023-12-11 DIAGNOSIS — Z419 Encounter for procedure for purposes other than remedying health state, unspecified: Secondary | ICD-10-CM | POA: Diagnosis not present

## 2024-01-08 DIAGNOSIS — Z419 Encounter for procedure for purposes other than remedying health state, unspecified: Secondary | ICD-10-CM | POA: Diagnosis not present

## 2024-02-19 DIAGNOSIS — Z419 Encounter for procedure for purposes other than remedying health state, unspecified: Secondary | ICD-10-CM | POA: Diagnosis not present

## 2024-03-20 DIAGNOSIS — Z419 Encounter for procedure for purposes other than remedying health state, unspecified: Secondary | ICD-10-CM | POA: Diagnosis not present

## 2024-03-31 ENCOUNTER — Ambulatory Visit: Admitting: Physician Assistant

## 2024-04-04 ENCOUNTER — Ambulatory Visit (INDEPENDENT_AMBULATORY_CARE_PROVIDER_SITE_OTHER): Admitting: Physician Assistant

## 2024-04-04 ENCOUNTER — Encounter: Payer: Self-pay | Admitting: Physician Assistant

## 2024-04-04 VITALS — BP 104/71 | HR 63 | Ht 60.0 in | Wt 111.0 lb

## 2024-04-04 DIAGNOSIS — K5903 Drug induced constipation: Secondary | ICD-10-CM | POA: Diagnosis not present

## 2024-04-04 DIAGNOSIS — Z0000 Encounter for general adult medical examination without abnormal findings: Secondary | ICD-10-CM | POA: Diagnosis not present

## 2024-04-04 NOTE — Progress Notes (Signed)
 New patient visit   Patient: Carrie Alvarado   DOB: 1991/03/07   33 y.o. Female  MRN: [id number] Visit Date: 04/04/2024  Today's healthcare provider: Trenton Frock, PA-C   Cc. New pt, check up  Subjective    Carrie Alvarado is a G55P2 33 y.o. female who presents today as a new patient to establish care.   Discussed the use of AI scribe software for clinical note transcription with the patient, who gave verbal consent to proceed.  History of Present Illness   Carrie Alvarado is a 33 year old female who presents wanting a physical.  She experiences general malaise.  Constipation persists despite using a stool softener, and she seeks a more effective medication. She attends a clinic in [place] for Suboxone every two weeks or as needed. She quit smoking in 2019.      Patient's last menstrual period was 03/26/2024.   Past Medical History:  Diagnosis Date   History of IUFD 12/21/2018   Fetal Hydrops and chest mass around 7 months. [x]  Antenatal testing.      Medical history non-contributory    Mental disorder    Supervision of high risk pregnancy, antepartum 08/12/2018    Nursing Staff Provider Office Location  [facility] Dating   6 wk ultrasound Language   English Anatomy US    Normal 19 wks Flu Vaccine   08/12/2018 Genetic Screen   AFP: Neg (declined additional screening per genetic counseling note) TDaP vaccine   10/26/18 GTT Third trimester:  [id number] Rhogam   n/a   LAB RESULTS  Feeding Plan  Breast Blood Type O/Positive/-- (10/04 1033)  Contraception  OCP or   Past Surgical History:  Procedure Laterality Date   BREAST SURGERY     Implants   CESAREAN SECTION     CESAREAN SECTION  03/13/2012   Procedure: CESAREAN SECTION;  Surgeon: Cyd Dowse, MD;  Location: [facility];  Service: Gynecology;  Laterality: N/A;  Repeat cesarean section with delivery of baby girl at 58.  Apgars 9/9.   CESAREAN SECTION WITH BILATERAL TUBAL LIGATION Bilateral 01/19/2019   Procedure: CESAREAN  SECTION WITH BILATERAL TUBAL LIGATION;  Surgeon: Julianne Octave, MD;  Location: [facility];  Service: Obstetrics;  Laterality: Bilateral;   OTHER SURGICAL HISTORY     Glass removed in arm   TUBAL LIGATION     Family Status  Relation Name Status   Mother  Alive   Father  Alive   Sister  Alive   Daughter  Alive   Sister  Alive   Sister  Alive   Sister  Alive   Sister  Alive   Daughter  Deceased   Neg Hx  (Not Specified)  No partnership data on file   Family History  Problem Relation Age of Onset   Hypotension Neg Hx    Anesthesia problems Neg Hx    Social History   Socioeconomic History   Marital status: Married    Spouse name: Not on file   Number of children: 2   Years of education: Not on file   Highest education level: Not on file  Occupational History   Not on file  Tobacco Use   Smoking status: Former    Current packs/day: 0.00    Types: Cigarettes    Quit date: 05/25/2018    Years since quitting: 5.8    Passive exposure: Never   Smokeless tobacco: Never  Vaping Use   Vaping status: Former  Substance and Sexual Activity  Alcohol use: Yes    Comment: socially   Drug use: No    Types: Marijuana    Comment: not with pregnancy   Sexual activity: Yes    Birth control/protection: Surgical  Other Topics Concern   Not on file  Social History Narrative   Not on file   Social Drivers of Health   Financial Resource Strain: Low Risk  (01/16/2019)   Overall Financial Resource Strain (CARDIA)    Difficulty of Paying Living Expenses: Not hard at all  Food Insecurity: No Food Insecurity (01/16/2019)   Hunger Vital Sign    Worried About Running Out of Food in the Last Year: Never true    Ran Out of Food in the Last Year: Never true  Transportation Needs: Unknown (01/16/2019)   PRAPARE - Administrator, Civil Service (Medical): No    Lack of Transportation (Non-Medical): Not on file  Physical Activity: Not on file  Stress: No Stress Concern Present  (01/16/2019)   [employer] of Occupational Health - Occupational Stress Questionnaire    Feeling of Stress : Only a little  Social Connections: Not on file   Outpatient Medications Prior to Visit  Medication Sig   SUBOXONE 8-2 MG FILM SMARTSIG:2 Strip(s) By Mouth Daily   No facility-administered medications prior to visit.   No Known Allergies  Immunization History  Administered Date(s) Administered   Influenza,inj,Quad PF,6+ Mos 08/12/2018   PFIZER(Purple Top)SARS-COV-2 Vaccination 07/11/2020, 08/03/2020   Tdap 10/26/2018, 07/10/2019    Health Maintenance  Topic Date Due   COVID-19 Vaccine (3 - Pfizer risk series) 08/31/2020   INFLUENZA VACCINE  06/09/2024   Cervical Cancer Screening (HPV/Pap Cotest)  11/21/2024   DTaP/Tdap/Td (3 - Td or Tdap) 07/09/2029   Hepatitis C Screening  Completed   HIV Screening  Completed   HPV VACCINES  Aged Out   Meningococcal B Vaccine  Aged Out    Patient Care Team: Trenton Frock, PA-C as PCP - General (Physician Assistant)  Review of Systems  Constitutional:  Negative for fatigue and fever.  Respiratory:  Negative for cough and shortness of breath.   Cardiovascular:  Negative for chest pain and leg swelling.  Gastrointestinal:  Negative for abdominal pain.  Neurological:  Negative for dizziness and headaches.        Objective    BP 104/71   Pulse 63   Ht 5' (1.524 m)   Wt 111 lb (50.3 kg)   LMP 03/26/2024   BMI 21.68 kg/m     Physical Exam Constitutional:      General: She is awake.     Appearance: She is well-developed. She is not ill-appearing.  HENT:     Head: Normocephalic.     Right Ear: Tympanic membrane normal.     Left Ear: Tympanic membrane normal.     Nose: Nose normal. No congestion or rhinorrhea.     Mouth/Throat:     Pharynx: No oropharyngeal exudate or posterior oropharyngeal erythema.  Eyes:     Conjunctiva/sclera: Conjunctivae normal.     Pupils: Pupils are equal, round, and reactive to  light.  Neck:     Thyroid: No thyroid mass or thyromegaly.  Cardiovascular:     Rate and Rhythm: Normal rate and regular rhythm.     Heart sounds: Normal heart sounds.  Pulmonary:     Effort: Pulmonary effort is normal.     Breath sounds: Normal breath sounds.  Abdominal:     Palpations: Abdomen is soft.  Tenderness: There is no abdominal tenderness.  Musculoskeletal:     Right lower leg: No swelling. No edema.     Left lower leg: No swelling. No edema.  Lymphadenopathy:     Cervical: No cervical adenopathy.  Skin:    General: Skin is warm.  Neurological:     Mental Status: She is alert and oriented to person, place, and time.  Psychiatric:        Attention and Perception: Attention normal.        Mood and Affect: Mood normal.        Speech: Speech normal.        Behavior: Behavior normal. Behavior is cooperative.     Depression Screen    04/04/2024    3:32 PM  PHQ 2/9 Scores  PHQ - 2 Score 3  PHQ- 9 Score 9   No results found for any visits on 04/04/24.  Assessment & Plan     Annual physical exam General recommendations: --balanced diet high in fiber and protein, low in sugars, carbs, fats. --physical activity/exercise 20-30 minutes 3-5 times a week    -     CBC with Differential/Platelet -     Comprehensive metabolic panel with GFR -     TSH -     Lipid panel -     Hemoglobin A1c  Drug-induced constipation Chronic constipation with current use of stool softeners - Add Miralax to current regimen. - Consider adding Metamucil to aid stool formation.   General Health Maintenance Immunizations and routine screenings up to date. Last Pap smear in 2023, next due in 2026, transitioning to every five years after age 29.       Return if symptoms worsen or fail to improve.      Trenton Frock, PA-C  [facility] [phone] (phone) [phone] (fax)  [facility]

## 2024-04-05 ENCOUNTER — Ambulatory Visit: Payer: Self-pay | Admitting: Physician Assistant

## 2024-04-05 LAB — CBC WITH DIFFERENTIAL/PLATELET
Basophils Absolute: 0.1 10*3/uL (ref 0.0–0.1)
Basophils Relative: 0.8 % (ref 0.0–3.0)
Eosinophils Absolute: 0.4 10*3/uL (ref 0.0–0.7)
Eosinophils Relative: 5.3 % — ABNORMAL HIGH (ref 0.0–5.0)
HCT: 40 % (ref 36.0–46.0)
Hemoglobin: 13 g/dL (ref 12.0–15.0)
Lymphocytes Relative: 29.1 % (ref 12.0–46.0)
Lymphs Abs: 2 10*3/uL (ref 0.7–4.0)
MCHC: 32.6 g/dL (ref 30.0–36.0)
MCV: 84.5 fl (ref 78.0–100.0)
Monocytes Absolute: 0.5 10*3/uL (ref 0.1–1.0)
Monocytes Relative: 6.6 % (ref 3.0–12.0)
Neutro Abs: 4.1 10*3/uL (ref 1.4–7.7)
Neutrophils Relative %: 58.2 % (ref 43.0–77.0)
Platelets: 276 10*3/uL (ref 150.0–400.0)
RBC: 4.74 Mil/uL (ref 3.87–5.11)
RDW: 12.8 % (ref 11.5–15.5)
WBC: 7 10*3/uL (ref 4.0–10.5)

## 2024-04-05 LAB — LIPID PANEL
Cholesterol: 141 mg/dL (ref 0–200)
HDL: 55.9 mg/dL (ref >39.00)
LDL Cholesterol: 69 mg/dL (ref 0–99)
NonHDL: 85.34
Total CHOL/HDL Ratio: 3
Triglycerides: 83 mg/dL (ref 0.0–149.0)
VLDL: 16.6 mg/dL (ref 0.0–40.0)

## 2024-04-05 LAB — COMPREHENSIVE METABOLIC PANEL WITH GFR
ALT: 24 U/L (ref 0–35)
AST: 14 U/L (ref 0–37)
Albumin: 4.7 g/dL (ref 3.5–5.2)
Alkaline Phosphatase: 67 U/L (ref 39–117)
BUN: 15 mg/dL (ref 6–23)
CO2: 32 meq/L (ref 19–32)
Calcium: 9.5 mg/dL (ref 8.4–10.5)
Chloride: 102 meq/L (ref 96–112)
Creatinine, Ser: 0.45 mg/dL (ref 0.40–1.20)
GFR: 128.39 mL/min (ref >60.00)
Glucose, Bld: 90 mg/dL (ref 70–99)
Potassium: 4.4 meq/L (ref 3.5–5.1)
Sodium: 139 meq/L (ref 135–145)
Total Bilirubin: 0.3 mg/dL (ref 0.2–1.2)
Total Protein: 7 g/dL (ref 6.0–8.3)

## 2024-04-05 LAB — TSH: TSH: 2.7 u[IU]/mL (ref 0.35–5.50)

## 2024-04-05 LAB — HEMOGLOBIN A1C: Hgb A1c MFr Bld: 5.2 % (ref 4.6–6.5)

## 2024-04-20 DIAGNOSIS — Z419 Encounter for procedure for purposes other than remedying health state, unspecified: Secondary | ICD-10-CM | POA: Diagnosis not present

## 2024-05-20 DIAGNOSIS — Z419 Encounter for procedure for purposes other than remedying health state, unspecified: Secondary | ICD-10-CM | POA: Diagnosis not present
